# Patient Record
Sex: Male | Born: 1970
Health system: Southern US, Community
[De-identification: ages and names within clinical notes are randomized; demographics above are authoritative.]

## PROBLEM LIST (undated history)

## (undated) DIAGNOSIS — K37 Unspecified appendicitis: Secondary | ICD-10-CM

## (undated) DIAGNOSIS — I10 Essential (primary) hypertension: Secondary | ICD-10-CM

## (undated) DIAGNOSIS — E7800 Pure hypercholesterolemia, unspecified: Secondary | ICD-10-CM

## (undated) DIAGNOSIS — I493 Ventricular premature depolarization: Secondary | ICD-10-CM

## (undated) HISTORY — DX: Essential (primary) hypertension: I10

## (undated) HISTORY — PX: BACK SURGERY: SHX140

## (undated) HISTORY — DX: Unspecified appendicitis: K37

## (undated) HISTORY — DX: Ventricular premature depolarization: I49.3

---
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
## 2001-09-12 ENCOUNTER — Emergency Department (HOSPITAL_COMMUNITY): Admission: EM | Admit: 2001-09-12 | Discharge: 2001-09-12 | Payer: Self-pay | Admitting: Emergency Medicine

## 2006-12-18 ENCOUNTER — Ambulatory Visit (HOSPITAL_COMMUNITY): Admission: RE | Admit: 2006-12-18 | Discharge: 2006-12-19 | Payer: Self-pay | Admitting: Orthopedic Surgery

## 2007-04-13 ENCOUNTER — Encounter: Admission: RE | Admit: 2007-04-13 | Discharge: 2007-04-13 | Payer: Self-pay

## 2007-05-27 ENCOUNTER — Inpatient Hospital Stay (HOSPITAL_COMMUNITY): Admission: RE | Admit: 2007-05-27 | Discharge: 2007-05-31 | Payer: Self-pay | Admitting: Neurosurgery

## 2007-12-15 ENCOUNTER — Encounter
Admission: RE | Admit: 2007-12-15 | Discharge: 2007-12-15 | Payer: Self-pay | Admitting: Physical Medicine & Rehabilitation

## 2010-07-15 ENCOUNTER — Encounter: Payer: Self-pay | Admitting: Physical Medicine and Rehabilitation

## 2010-11-06 NOTE — Op Note (Signed)
NAMEGOODWIN, KAMPHAUS                ACCOUNT NO.:  [id number]   MEDICAL RECORD NO.:  [id number]          PATIENT TYPE:  INP   LOCATION:  3010                         FACILITY:  [facility]   PHYSICIAN:  Cristi Loron, M.D.DATE OF BIRTH:  24-Jun-1971   DATE OF PROCEDURE:  05/27/2007  DATE OF DISCHARGE:                               OPERATIVE REPORT   BRIEF HISTORY:  The patient is a 40 year old black male who has  undergone a previous right L5-S1 diskectomy performed by another  physician.  He had persistent back and leg pain.  He failed medical  management and worked up with a diskogram which demonstrated concordant  pain at L5-S1 as well as degenerated disk.  The patient was sent for my  opinion.  We discussed various treatment options including surgery.  The  patient has weighed the risks, benefits, and alternatives of the surgery  and decided to proceed with L5-S1 decompression and fusion.   PREOPERATIVE DIAGNOSES:  L5-S1 degenerative disk disease, spondylosis,  lumbago, and radiculopathy.   POSTOPERATIVE DIAGNOSES:  L5-S1 degenerative disk disease, spondylosis,  lumbago, and radiculopathy.   PROCEDURE:  Right L5 redo laminectomy with foraminotomy to decompress  the right L5 and S1 nerve root; right L5-S1 transforaminal lumbar  interbody fusion with local morselized autograft bone and VITOSS bone  graft extender; insertion of right L5-S1 interbody prosthesis (Capstone  PEEK interbody prosthesis); L5-S1 posterior nonsegmental instrumentation  with Legacy titanium pedicle screws and rods; L5-S1 posterolateral  arthrodesis with local morselized autograft bone and VITOSS bone graft  extender.   SURGEON:  Cristi Loron, M.D.   ASSISTANT:  Coletta Memos, M.D.   ANESTHESIA:  General endotracheal.   ESTIMATED BLOOD LOSS:  300 mL.   SPECIMENS:  None.   DRAINS:  None.   COMPLICATIONS:  None.   DESCRIPTION OF PROCEDURE:  The patient was brought to the operating room  by the  anesthesia team.  General endotracheal anesthesia was induced.  The patient was then turned to the prone position on Wilson frame.  His  lumbosacral region was then prepared with Betadine scrub and Betadine  solution.  Sterile drapes were applied.  I then injected the area to be  incised with Marcaine with epinephrine solution.  I used a scalpel  incise the patient's prior surgical incision.  I extended the incision  in a cephalad and caudal direction.  I then used electrocautery to  perform a bilateral subperiosteal dissection exposing the spinous  processs of lamina of L4-L5 in the upper sacrum.  We obtained  intraoperative radiograph to confirm our location.   We then inserted the Versa-Trac retractor for exposure.  I then used  high-speed drill to extend the patient's prior right L5 laminotomy in a  cephalad direction.  I drilled until I encounter some relatively non-  scarred ligamentum flavum.  I then used Kerrison punch to remove the  remainder of right L5-S1 ligamentum flavum.  I carefully dissected  through the epidural fibrosis and then decompressed the thecal sac and  then performed a foraminotomy about the right L5-S1 nerve root.  The  extent  of this decompression was needed for a transforaminal lumbar  fusion secondary to the epidural fibrosis and redundant ligamentum  flavum.   Having competed decompression, we now turned our attention to the  arthrodesis.  I incised the right L5-S1 intervertebral disk with a 15  blade scalpel and performed a partial intervertebral diskectomy with the  pituitary forceps.  We prepared the vertebral endplates for fusion by  removing the soft tissue using the curette and pituitary forceps.  We  then used trial spacers and determined to use a 12 x 26 mm PEEK Capstone  interbody prosthesis.  We pre-filled the prosthesis with local autograft  bone and VITOSS bone graft extender as well as filling anteriorly and  laterally in the disk space with  VITOSS and local bone.  We then  inserted the prosthesis into the interspace of course after retracting  the neural structures out of harm's way.  We then attempted to turn the  prosthesis somewhat laterally.  I could not turn it completely  laterally.  We then filled posteriorly in the disk spacer with VITOSS  and local autograft bone completing the transforaminal lumbar interbody  fusion and insertion of the interbody prosthesis.   We now turned our attention to instrumentation.  Under fluoroscopic  guidance, I cannulated the bilateral L5 and S1 pedicles with the bone  probes.  We tapped the pedicles with 7.5 mm taps then probed inside the  tapped pedicles to rule out cortical breeches.  We then inserted a 7.5 x  50 mm pedicle screws bilaterally at L5 and 7.5 X 45 bilaterally at S1  under fluoroscopic guidance.  We then connected the unilateral pedicle  screw through the lordotic rod.  We compressed the construct and then  secured the rod in place with the capsule, which we tightened  appropriately completing the instrumentation.  the rod in place with caps which were tightened appropriately completing  the instrumentation.   We now turned out attention to the posterolateral arthrodesis.  We used  a high-speed drill to decorticate the remainder of the left L5 and S1  pars region, transverse processes facet joints.  We laid a combination  of local autograft bone and VITOSS bone graft extender over these  decorticated posterolateral structures completing the posterolateral  arthrodesis.   We then inspected the right L5-S1 thecal sac and right L5 and S1 nerve  roots and noted them well decompressed.  We irrigated the wound out with  bacitracin solution and then removed the retractor and obtained  hemostasis using bipolar cautery, and then I reapproximated the  patient's thoracolumbar fascia with interrupted #1 Vicryl, subcutaneous  tissue with interrupted 2-0 Vicryl, the skin with  Steri-Strips and  Benzoin.  The wound was then coated with bacitracin ointment and sterile  dressing was applied.  The drapes were removed.  The patient was  subsequently returned to the supine position where he was extubated by  the anesthesia team and transported to the postanesthesia care unit in  stable condition.  All sponge, instrument, and needle counts were  correct at the end of the case.      Cristi Loron, M.D.  Electronically Signed     JDJ/MEDQ  D:  05/27/2007  T:  05/28/2007  Job:  [id number]

## 2010-11-06 NOTE — Discharge Summary (Signed)
[name], [name]                ACCOUNT NO.:  [id number]   MEDICAL RECORD NO.:  [id number]          PATIENT TYPE:  INP   LOCATION:  3010                         FACILITY:  [facility]   PHYSICIAN:  Danae Orleans. Venetia Maxon, M.D.  DATE OF BIRTH:  February 28, 1971   DATE OF ADMISSION:  05/27/2007  DATE OF DISCHARGE:  05/31/2007                               DISCHARGE SUMMARY   REASON FOR ADMISSION:  Lumbar disk generation with spondylosis, lumbago  and radiculopathy.   FINAL DIAGNOSIS:  Lumbar disk generation with spondylosis, lumbago and  radiculopathy.   SECONDARY DIAGNOSIS:  1. Tachycardia.  2. History of ablation for arrhythmia.   HISTORY/HOSPITAL COURSE:  Joseph Reyes is a 40 year old man who had  previously undergone lumbar diskectomy.  He developed progressive  degeneration at L5-S1 and underwent a lumbar decompression and fusion.  He had postoperatively had some difficulty with pain management, was  gradually mobilized and these issues improved.  He developed a fever  which also resolved.  He was tachycardic on the 6th and because of his  history of previous cardiac illness, he had an EKG which showed sinus  tachycardia.  He had blood work which showed satisfactory electrolytes  and hematocrit.  With his pain under good control on the 7th, he was no  longer tachycardic, was doing better, ambulating well and was discharged  home with instructions to follow up with Dr. Lovell Sheehan in the office in 2  to 3 3 weeks.  Instructed to wear his brace when up.  Given  prescriptions for Dilaudid, 4 mg p.o. q.4-6h. as needed, dispensed #100  and Valium as needed for muscular spasm.      Danae Orleans. Venetia Maxon, M.D.  Electronically Signed     JDS/MEDQ  D:  05/31/2007  T:  05/31/2007  Job:  [id number]

## 2010-11-06 NOTE — Op Note (Signed)
Joseph Reyes, Joseph Reyes                ACCOUNT NO.:  [id number]   MEDICAL RECORD NO.:  [id number]          PATIENT TYPE:  AMB   LOCATION:  DAY                          FACILITY:  [facility]   PHYSICIAN:  Ronald A. Gioffre, M.D.DATE OF BIRTH:  1971/02/05   DATE OF PROCEDURE:  12/18/2006  DATE OF DISCHARGE:                               OPERATIVE REPORT   SURGEON:  Georges Lynch. Darrelyn Hillock, M.D.   ASSISTANT:  Marlowe Kays M.D.   PREOPERATIVE DIAGNOSIS:  Large herniated lumbar disk the lateral recess  stenosis at L5-S1 on the right.  Note, he had severe pain in his right  leg, minimal in the left.   POSTOPERATIVE DIAGNOSIS:  Large herniated lumbar disk the lateral recess  stenosis at L5-S1 on the right.  Note, he had severe pain in his right  leg, minimal in the left.   OPERATION:  1. Hemilaminectomy and microdiskectomy L5-S1 on the right.  2. Decompression of the lateral recess for lateral recess stenosis at      L5-S1 on the right.   PROCEDURE:  Under general anesthesia with the patient on a spinal frame,  routine orthopedic prep and draping of the back was carried out.  He had  2 grams of IV Ancef.  Two needles were placed in the back for  localization purposes and an x-ray was taken.  An incision then was made  over the L5-S1 interspace, bleeders identified and cauterized.  The  muscle was stripped from the lamina and spinous process in the usual  fashion.  At this time we went down and inserted the [facility]  retractors.  Another x-ray was taken with an instrument in the  interlaminar space of L5-S1 and confirmed our position.  I then carried  out hemilaminectomy in the usual fashion.  I removed the ligamentum  flavum.  Note, microscope was used during the procedure.  His nerve root  was so tense and so swollen that we had to go out and decompress the  lateral recess as well where he had recess stenosis.  We did a nice wide  foraminotomy.  We totally freed up the root first because of  the size of  the disk.  We then gently retracted the nerve root with the D'Errico  retractor, made a cruciate incision in the posterior longitudinal  ligament and removed large pieces of disk material.  Multiple passes  were carried out into the disk space.  I also decompressed the disk from  the subligamentous space with the Epstein curettes.  Nerve hook also was  used.  We went down and explored proximally and distally and made sure  there were no other loose fragments.  We were able to easily pass a  hockey-stick out the foramina after the decompression.  The nerve root  moved much more freely after the decompression.  Thoroughly irrigated  out the area, loosely applied some thrombin-soaked Gelfoam, closed the  wound in layers in usual fashion except the deep distal portion of the  wound was partially left open for drainage purposes.  I injected about  20 mL of 0.5%  Marcaine and epinephrine into the muscle.  The skin was  closed with metal staples.  Sterile Neosporin dressing was applied.           ______________________________  Georges Lynch Darrelyn Hillock, M.D.    RAG/MEDQ  D:  12/18/2006  T:  12/18/2006  Job:  [id number]

## 2011-04-01 LAB — CBC
HCT: 38.1 — ABNORMAL LOW
HCT: 45.8
Hemoglobin: 13.1
MCHC: 35.1
MCV: 89.2
MCV: 89.6
Platelets: 208
Platelets: 227
RDW: 13.2
RDW: 13.3
WBC: 10.9 — ABNORMAL HIGH
WBC: 5

## 2011-04-01 LAB — BASIC METABOLIC PANEL
CO2: 28
CO2: 28
Calcium: 8.8
Chloride: 100
Chloride: 101
Creatinine, Ser: 0.92
GFR calc Af Amer: >60
GFR calc Af Amer: >60
GFR calc non Af Amer: >60
GFR calc non Af Amer: >60
Sodium: 134 — ABNORMAL LOW

## 2011-04-01 LAB — TYPE AND SCREEN
ABO/RH(D): A POS
Antibody Screen: NEGATIVE

## 2011-04-01 LAB — ABO/RH: ABO/RH(D): A POS

## 2011-04-10 LAB — APTT: aPTT: 25

## 2011-04-10 LAB — PROTIME-INR: Prothrombin Time: 12.9

## 2011-04-10 LAB — HEMOGLOBIN AND HEMATOCRIT, BLOOD: HCT: 45.8

## 2012-08-13 ENCOUNTER — Emergency Department (HOSPITAL_COMMUNITY)
Admission: EM | Admit: 2012-08-13 | Discharge: 2012-08-14 | Disposition: A | Discharge status: Home / Self Care | Payer: No Typology Code available for payment source | Attending: Emergency Medicine | Admitting: Emergency Medicine

## 2012-08-13 ENCOUNTER — Encounter (HOSPITAL_COMMUNITY): Payer: Self-pay | Admitting: *Deleted

## 2012-08-13 ENCOUNTER — Emergency Department (HOSPITAL_COMMUNITY): Payer: No Typology Code available for payment source

## 2012-08-13 DIAGNOSIS — S335XXA Sprain of ligaments of lumbar spine, initial encounter: Secondary | ICD-10-CM | POA: Insufficient documentation

## 2012-08-13 DIAGNOSIS — M549 Dorsalgia, unspecified: Secondary | ICD-10-CM

## 2012-08-13 DIAGNOSIS — Y9389 Activity, other specified: Secondary | ICD-10-CM | POA: Insufficient documentation

## 2012-08-13 MED ORDER — METHOCARBAMOL 500 MG PO TABS
500.0000 mg | ORAL_TABLET | Freq: Once | ORAL | Status: AC
  Administered 2012-08-13: 500 mg via ORAL
  Filled 2012-08-13: qty 1

## 2012-08-13 MED ORDER — TRAMADOL HCL 50 MG PO TABS: 50.0000 mg | ORAL_TABLET | Freq: Four times a day (QID) | ORAL | Status: DC | PRN

## 2012-08-13 MED ORDER — METHOCARBAMOL 500 MG PO TABS: 500.0000 mg | ORAL_TABLET | Freq: Two times a day (BID) | ORAL | Status: DC

## 2012-08-13 MED ORDER — TRAMADOL HCL 50 MG PO TABS
50.0000 mg | ORAL_TABLET | Freq: Once | ORAL | Status: AC
  Administered 2012-08-13: 50 mg via ORAL
  Filled 2012-08-13: qty 1
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
## 2012-08-13 NOTE — ED Notes (Signed)
PT in X-ray.
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
## 2012-08-14 NOTE — ED Provider Notes (Signed)
Medical screening examination/treatment/procedure(s) were performed by non-physician practitioner and as supervising physician I was immediately available for consultation/collaboration.  Olivia Mackie, MD 08/14/12 [phone]

## 2014-10-30 ENCOUNTER — Emergency Department (HOSPITAL_COMMUNITY)
Admission: EM | Admit: 2014-10-30 | Discharge: 2014-10-30 | Disposition: A | Discharge status: Home / Self Care | Payer: Self-pay | Attending: Emergency Medicine | Admitting: Emergency Medicine

## 2014-10-30 ENCOUNTER — Encounter (HOSPITAL_COMMUNITY): Payer: Self-pay | Admitting: Emergency Medicine

## 2014-10-30 DIAGNOSIS — Y998 Other external cause status: Secondary | ICD-10-CM | POA: Insufficient documentation

## 2014-10-30 DIAGNOSIS — Z79899 Other long term (current) drug therapy: Secondary | ICD-10-CM | POA: Insufficient documentation

## 2014-10-30 DIAGNOSIS — W57XXXA Bitten or stung by nonvenomous insect and other nonvenomous arthropods, initial encounter: Secondary | ICD-10-CM | POA: Insufficient documentation

## 2014-10-30 DIAGNOSIS — S60561A Insect bite (nonvenomous) of right hand, initial encounter: Secondary | ICD-10-CM | POA: Insufficient documentation

## 2014-10-30 DIAGNOSIS — Z8639 Personal history of other endocrine, nutritional and metabolic disease: Secondary | ICD-10-CM | POA: Insufficient documentation

## 2014-10-30 DIAGNOSIS — Y9289 Other specified places as the place of occurrence of the external cause: Secondary | ICD-10-CM | POA: Insufficient documentation

## 2014-10-30 DIAGNOSIS — S60562A Insect bite (nonvenomous) of left hand, initial encounter: Secondary | ICD-10-CM | POA: Insufficient documentation

## 2014-10-30 DIAGNOSIS — R21 Rash and other nonspecific skin eruption: Secondary | ICD-10-CM

## 2014-10-30 DIAGNOSIS — Y9389 Activity, other specified: Secondary | ICD-10-CM | POA: Insufficient documentation

## 2014-10-30 HISTORY — DX: Pure hypercholesterolemia, unspecified: E78.00

## 2014-10-30 MED ORDER — DOXYCYCLINE HYCLATE 100 MG PO CAPS: 100.0000 mg | ORAL_CAPSULE | Freq: Two times a day (BID) | ORAL | Status: DC

## 2014-10-30 NOTE — ED Provider Notes (Signed)
CSN: [id number]     Arrival date & time 10/30/14  0557 History   First MD Initiated Contact with Patient 10/30/14 0630     Chief Complaint  Patient presents with  . Rash     (Consider location/radiation/quality/duration/timing/severity/associated sxs/prior Treatment) HPI Joseph Reyes is a 44 y.o. male who comes in for evaluation of tick bite and subsequent rash. Patient states he believes he was bitten by a tick on Tuesday, found the tick attached on his left flank on Thursday. Patient brought tick with him. It appears to be a deer tick and is not engorged. Patient reports associated rash to bilateral upper extremities, non pruritic. Denies fevers, headache, neck stiffness or pain, nausea or vomiting, abdominal pain, difficulties breathing or swallowing. Reports he has been taking Tylenol, Benadryl and Zyrtec. No other aggravating or modifying factors.  Past Medical History  Diagnosis Date  . Hypercholesteremia    Past Surgical History  Procedure Laterality Date  . Back surgery     No family history on file. History  Substance Use Topics  . Smoking status: Never Smoker   . Smokeless tobacco: Not on file  . Alcohol Use: No    Review of Systems A 10 point review of systems was completed and was negative except for pertinent positives and negatives as mentioned in the history of present illness     Allergies  Ibuprofen and Percocet  Home Medications   Prior to Admission medications   Medication Sig Start Date End Date Taking? Authorizing Provider  doxycycline (VIBRAMYCIN) 100 MG capsule Take 1 capsule (100 mg total) by mouth 2 (two) times daily. One po bid x 10 days 10/30/14   Joycie PeekBenjamin Claus Silvestro, PA-C  methocarbamol (ROBAXIN) 500 MG tablet Take 1 tablet (500 mg total) by mouth 2 (two) times daily. 08/13/12   Ivonne AndrewPeter Dammen, PA-C  traMADol (ULTRAM) 50 MG tablet Take 1 tablet (50 mg total) by mouth every 6 (six) hours as needed for pain. 08/13/12   Peter Dammen, PA-C   BP 141/81 mmHg   Pulse 65  Temp(Src) 98.2 F (36.8 C) (Oral)  Resp 14  Ht 6\' 4"  (1.93 m)  Wt 300 lb (136.079 kg)  BMI 36.53 kg/m2  SpO2 98% Physical Exam  Constitutional: He is oriented to person, place, and time. He appears well-developed and well-nourished.  HENT:  Head: Normocephalic and atraumatic.  Mouth/Throat: Oropharynx is clear and moist.  Eyes: Conjunctivae are normal. Pupils are equal, round, and reactive to light. Right eye exhibits no discharge. Left eye exhibits no discharge. No scleral icterus.  Neck: Neck supple.  Cardiovascular: Normal rate, regular rhythm and normal heart sounds.   Pulmonary/Chest: Effort normal and breath sounds normal. No respiratory distress. He has no wheezes. He has no rales.  Abdominal: Soft. There is no tenderness.  Musculoskeletal: He exhibits no tenderness.  Neurological: He is alert and oriented to person, place, and time.  Cranial Nerves II-XII grossly intact. Moves all extremities without ataxia. Appears to be baseline for patient.  Skin: Skin is warm and dry. No rash noted.  Small, diffuse maculopapular, circumferential lesions with excoriations throughout bilateral upper extremities. No drainage or scaling. No central clearing, no necrosis. No rash or lesions noted to left flank.  Psychiatric: He has a normal mood and affect.  Nursing note and vitals reviewed.   ED Course  Procedures (including critical care time) Labs Review Labs Reviewed - No data to display  Imaging Review No results found.   EKG Interpretation None  Meds given in ED:  Medications - No data to display  Discharge Medication List as of 10/30/2014  6:48 AM    START taking these medications   Details  doxycycline (VIBRAMYCIN) 100 MG capsule Take 1 capsule (100 mg total) by mouth 2 (two) times daily. One po bid x 10 days, Starting 10/30/2014, Until Discontinued, Print       Filed Vitals:   10/30/14 0610 10/30/14 0611 10/30/14 0612 10/30/14 0657  BP: 141/81  141/81  141/81  Pulse:  65  76  Temp:    98.4 F (36.9 C)  TempSrc:    Oral  Resp:    16  Height:      Weight:      SpO2:  98%  98%    MDM  Vitals stable - WNL -afebrile Pt resting comfortably in ED. PE--no evidence of erythema migrans or other concerning rash. Otherwise grossly benign physical exam  DDX--patient found tick on his body, attached but not engorged. Reports subsequent bilateral upper extremity rash. Denies fevers, chills, headache, nausea or vomiting, abdominal pain, neurological symptoms. I doubt Lyme or RMSF, however will treat with doxycycline. Encourage continue use of Tylenol, Zyrtec and Benadryl for other allergy symptoms.  I discussed all relevant lab findings and imaging results with pt and they verbalized understanding. Discussed f/u with PCP within 48 hrs and return precautions, pt very amenable to plan.  Final diagnoses:  Rash  Tick bite        Joycie PeekBenjamin Adiel Erney, PA-C 10/30/14 [phone]  Geoffery Lyonsouglas Delo, MD 10/30/14 2042

## 2014-10-30 NOTE — Discharge Instructions (Signed)
Please take your antibiotics as prescribed. Take all of the antibiotics until gone. Follow-up with your primary care for further evaluation and management of your symptoms. Return to ED for new or worsening symptoms like fever, headache, nausea or vomiting, abdominal pain, neurologic symptoms.  Lyme Disease You may have been bitten by a tick and are to watch for the development of Lyme Disease. Lyme Disease is an infection that is caused by a bacteria The bacteria causing this disease is named Borreilia burgdorferi. If a tick is infected with this bacteria and then bites you, then Lyme Disease may occur. These ticks are carried by deer and rodents such as rabbits and mice and infest grassy as well as forested areas. Fortunately most tick bites do not cause Lyme Disease.  Lyme Disease is easier to prevent than to treat. First, covering your legs with clothing when walking in areas where ticks are possibly abundant will prevent their attachment because ticks tend to stay within inches of the ground. Second, using insecticides containing DEET can be applied on skin or clothing. Last, because it takes about 12 to 24 hours for the tick to transmit the disease after attachment to the human host, you should inspect your body for ticks twice a day when you are in areas where Lyme Disease is common. You must look thoroughly when searching for ticks. The Ixodes tick that carries Lyme Disease is very small. It is around the size of a sesame seed (picture of tick is not actual size). Removal is best done by grasping the tick by the head and pulling it out. Do not to squeeze the body of the tick. This could inject the infecting bacteria into the bite site. Wash the area of the bite with an antiseptic solution after removal.  Lyme Disease is a disease that may affect many body systems. Because of the small size of the biting tick, most people do not notice being bitten. The first sign of an infection is usually a round red  rash that extends out from the center of the tick bite. The center of the lesion may be blood colored (hemorrhagic) or have tiny blisters (vesicular). Most lesions have bright red outer borders and partial central clearing. This rash may extend out many inches in diameter, and multiple lesions may be present. Other symptoms such as fatigue, headaches, chills and fever, general achiness and swelling of lymph glands may also occur. If this first stage of the disease is left untreated, these symptoms may gradually resolve by themselves, or progressive symptoms may occur because of spread of infection to other areas of the body.  Follow up with your caregiver to have testing and treatment if you have a tick bite and you develop any of the above complaints. Your caregiver may recommend preventative (prophylactic) medications which kill bacteria (antibiotics). Once a diagnosis of Lyme Disease is made, antibiotic treatment is highly likely to cure the disease. Effective treatment of late stage Lyme Disease may require longer courses of antibiotic therapy.  MAKE SURE YOU:   Understand these instructions.  Will watch your condition.  Will get help right away if you are not doing well or get worse. Document Released: 09/16/2000 Document Revised: 09/02/2011 Document Reviewed: 11/18/2008 [facility] Patient Information 2015 [place]. This information is not intended to replace advice given to you by your health care provider. Make sure you discuss any questions you have with your health care provider.

## 2014-10-30 NOTE — ED Notes (Addendum)
Pt reports rash on BUE and hands beginning Tuesday, itching waking pt from sleep. Pt denies pain.   Pt reports finding a tick on his back on Thursday.  Pt denies headache,N/V/D, fevers or chills.

## 2015-02-01 ENCOUNTER — Observation Stay (HOSPITAL_COMMUNITY)
Admission: EM | Admit: 2015-02-01 | Discharge: 2015-02-03 | Disposition: A | Discharge status: Home / Self Care | Payer: 59 | Attending: Surgery | Admitting: Surgery

## 2015-02-01 ENCOUNTER — Encounter (HOSPITAL_COMMUNITY): Payer: Self-pay | Admitting: Emergency Medicine

## 2015-02-01 DIAGNOSIS — K550 Acute vascular disorders of intestine: Secondary | ICD-10-CM | POA: Diagnosis not present

## 2015-02-01 DIAGNOSIS — E780 Pure hypercholesterolemia: Secondary | ICD-10-CM | POA: Insufficient documentation

## 2015-02-01 DIAGNOSIS — Z885 Allergy status to narcotic agent status: Secondary | ICD-10-CM | POA: Insufficient documentation

## 2015-02-01 DIAGNOSIS — K3580 Unspecified acute appendicitis: Secondary | ICD-10-CM | POA: Diagnosis present

## 2015-02-01 DIAGNOSIS — Z79899 Other long term (current) drug therapy: Secondary | ICD-10-CM | POA: Diagnosis not present

## 2015-02-01 DIAGNOSIS — N400 Enlarged prostate without lower urinary tract symptoms: Secondary | ICD-10-CM | POA: Diagnosis not present

## 2015-02-01 DIAGNOSIS — Z886 Allergy status to analgesic agent status: Secondary | ICD-10-CM | POA: Insufficient documentation

## 2015-02-01 DIAGNOSIS — R1031 Right lower quadrant pain: Secondary | ICD-10-CM | POA: Diagnosis not present

## 2015-02-01 DIAGNOSIS — K37 Unspecified appendicitis: Secondary | ICD-10-CM | POA: Diagnosis present

## 2015-02-01 NOTE — ED Notes (Signed)
Pt states he has been having abd pain off and on for about a week  Pt states this morning the pain was different  Pt states the pain is on the right side  Pt states it fluctuates between dull and sharp in nature   Denies N/V/D

## 2015-02-02 ENCOUNTER — Observation Stay (HOSPITAL_COMMUNITY): Payer: 59 | Admitting: Anesthesiology

## 2015-02-02 ENCOUNTER — Encounter (HOSPITAL_COMMUNITY): Payer: Self-pay | Admitting: Surgery

## 2015-02-02 ENCOUNTER — Encounter (HOSPITAL_COMMUNITY)
Admission: EM | Disposition: A | Discharge status: Home / Self Care | Payer: Self-pay | Source: Home / Self Care | Attending: Emergency Medicine

## 2015-02-02 ENCOUNTER — Emergency Department (HOSPITAL_COMMUNITY): Payer: 59

## 2015-02-02 DIAGNOSIS — K3580 Unspecified acute appendicitis: Secondary | ICD-10-CM | POA: Diagnosis present

## 2015-02-02 DIAGNOSIS — K37 Unspecified appendicitis: Secondary | ICD-10-CM | POA: Diagnosis present

## 2015-02-02 HISTORY — PX: LAPAROSCOPIC APPENDECTOMY: SHX408

## 2015-02-02 LAB — CBC
HCT: 41.8 % (ref 39.0–52.0)
HEMATOCRIT: 45.2 % (ref 39.0–52.0)
Hemoglobin: 15.2 g/dL (ref 13.0–17.0)
Hemoglobin: 14.3 g/dL (ref 13.0–17.0)
MCH: 30.6 pg (ref 26.0–34.0)
MCH: 31.2 pg (ref 26.0–34.0)
MCHC: 33.6 g/dL (ref 30.0–36.0)
MCHC: 34.2 g/dL (ref 30.0–36.0)
MCV: 91.1 fL (ref 78.0–100.0)
MCV: 91.3 fL (ref 78.0–100.0)
PLATELETS: 237 10*3/uL (ref 150–400)
Platelets: 273 10*3/uL (ref 150–400)
RBC: 4.96 MIL/uL (ref 4.22–5.81)
RBC: 4.58 MIL/uL (ref 4.22–5.81)
RDW: 13.5 % (ref 11.5–15.5)
RDW: 13.4 % (ref 11.5–15.5)
WBC: 5.4 10*3/uL (ref 4.0–10.5)
WBC: 7.2 10*3/uL (ref 4.0–10.5)

## 2015-02-02 LAB — URINE MICROSCOPIC-ADD ON

## 2015-02-02 LAB — CREATININE, SERUM
CREATININE: 0.99 mg/dL (ref 0.61–1.24)
GFR calc Af Amer: >60 mL/min (ref >60)

## 2015-02-02 LAB — COMPREHENSIVE METABOLIC PANEL
ALK PHOS: 56 U/L (ref 38–126)
ALT: 30 U/L (ref 17–63)
ANION GAP: 8 (ref 5–15)
AST: 22 U/L (ref 15–41)
Albumin: 4.3 g/dL (ref 3.5–5.0)
BUN: 15 mg/dL (ref 6–20)
CHLORIDE: 106 mmol/L (ref 101–111)
CO2: 28 mmol/L (ref 22–32)
CREATININE: 1.19 mg/dL (ref 0.61–1.24)
Calcium: 9.7 mg/dL (ref 8.9–10.3)
GFR calc non Af Amer: >60 mL/min (ref >60)
GLUCOSE: 110 mg/dL — AB (ref 65–99)
Potassium: 4.2 mmol/L (ref 3.5–5.1)
SODIUM: 142 mmol/L (ref 135–145)
Total Bilirubin: 1.1 mg/dL (ref 0.3–1.2)
Total Protein: 8.1 g/dL (ref 6.5–8.1)

## 2015-02-02 LAB — URINALYSIS, ROUTINE W REFLEX MICROSCOPIC
Bilirubin Urine: NEGATIVE
GLUCOSE, UA: NEGATIVE mg/dL
KETONES UR: NEGATIVE mg/dL
Leukocytes, UA: NEGATIVE
Nitrite: NEGATIVE
PROTEIN: NEGATIVE mg/dL
Specific Gravity, Urine: 1.029 (ref 1.005–1.030)
UROBILINOGEN UA: 1 mg/dL (ref 0.0–1.0)
pH: 5.5 (ref 5.0–8.0)

## 2015-02-02 LAB — LIPASE, BLOOD: Lipase: 17 U/L — ABNORMAL LOW (ref 22–51)

## 2015-02-02 SURGERY — APPENDECTOMY, LAPAROSCOPIC
Anesthesia: General

## 2015-02-02 MED ORDER — ONDANSETRON HCL 4 MG/2ML IJ SOLN: 4.0000 mg | Freq: Four times a day (QID) | INTRAMUSCULAR | Status: DC | PRN

## 2015-02-02 MED ORDER — OXYCODONE HCL 5 MG PO TABS: 5.0000 mg | ORAL_TABLET | ORAL | Status: DC | PRN

## 2015-02-02 MED ORDER — SODIUM CHLORIDE 0.9 % IV SOLN
3.0000 g | Freq: Once | INTRAVENOUS | Status: AC
  Administered 2015-02-02: 3 g via INTRAVENOUS
  Filled 2015-02-02: qty 3

## 2015-02-02 MED ORDER — FENTANYL CITRATE (PF) 100 MCG/2ML IJ SOLN
25.0000 ug | INTRAMUSCULAR | Status: DC | PRN
  Administered 2015-02-02 (×2): 50 ug via INTRAVENOUS

## 2015-02-02 MED ORDER — SODIUM CHLORIDE 0.9 % IV SOLN
INTRAVENOUS | Status: DC
  Administered 2015-02-02: 04:00:00 via INTRAVENOUS

## 2015-02-02 MED ORDER — LIDOCAINE HCL (CARDIAC) 20 MG/ML IV SOLN
INTRAVENOUS | Status: AC
  Filled 2015-02-02: qty 5

## 2015-02-02 MED ORDER — SODIUM CHLORIDE 0.9 % IV SOLN
3.0000 g | Freq: Four times a day (QID) | INTRAVENOUS | Status: AC
  Administered 2015-02-02 – 2015-02-03 (×4): 3 g via INTRAVENOUS
  Filled 2015-02-02 (×4): qty 3

## 2015-02-02 MED ORDER — SODIUM CHLORIDE 0.9 % IV SOLN: 3.0000 g | Freq: Four times a day (QID) | INTRAVENOUS | Status: DC

## 2015-02-02 MED ORDER — MENTHOL 3 MG MT LOZG
1.0000 | LOZENGE | OROMUCOSAL | Status: DC | PRN
  Filled 2015-02-02 (×2): qty 9

## 2015-02-02 MED ORDER — FENTANYL CITRATE (PF) 100 MCG/2ML IJ SOLN
50.0000 ug | Freq: Once | INTRAMUSCULAR | Status: AC
  Administered 2015-02-02: 50 ug via INTRAVENOUS
  Filled 2015-02-02: qty 2

## 2015-02-02 MED ORDER — HEPARIN SODIUM (PORCINE) 5000 UNIT/ML IJ SOLN
5000.0000 [IU] | Freq: Three times a day (TID) | INTRAMUSCULAR | Status: DC
Start: 2015-02-02 — End: 2015-02-03
  Administered 2015-02-02 – 2015-02-03 (×2): 5000 [IU] via SUBCUTANEOUS
  Filled 2015-02-02 (×5): qty 1

## 2015-02-02 MED ORDER — PROPOFOL 10 MG/ML IV BOLUS
INTRAVENOUS | Status: AC
Start: 2015-02-02 — End: 2015-02-02
  Filled 2015-02-02: qty 20

## 2015-02-02 MED ORDER — LACTATED RINGERS IV SOLN
INTRAVENOUS | Status: DC | PRN
Start: 2015-02-02 — End: 2015-02-02
  Administered 2015-02-02 (×2): via INTRAVENOUS

## 2015-02-02 MED ORDER — ONDANSETRON 4 MG PO TBDP: 4.0000 mg | ORAL_TABLET | Freq: Four times a day (QID) | ORAL | Status: DC | PRN

## 2015-02-02 MED ORDER — NEOSTIGMINE METHYLSULFATE 10 MG/10ML IV SOLN
INTRAVENOUS | Status: DC | PRN
  Administered 2015-02-02: 4 mg via INTRAVENOUS

## 2015-02-02 MED ORDER — LACTATED RINGERS IV SOLN: INTRAVENOUS | Status: DC

## 2015-02-02 MED ORDER — SODIUM CHLORIDE 0.9 % IV SOLN
3.0000 g | Freq: Four times a day (QID) | INTRAVENOUS | Status: DC
  Filled 2015-02-02: qty 3

## 2015-02-02 MED ORDER — HYDROMORPHONE HCL 1 MG/ML IJ SOLN
1.0000 mg | INTRAMUSCULAR | Status: DC | PRN
  Administered 2015-02-02 (×2): 1 mg via INTRAVENOUS
  Filled 2015-02-02 (×2): qty 1

## 2015-02-02 MED ORDER — FENTANYL CITRATE (PF) 100 MCG/2ML IJ SOLN: 12.5000 ug | INTRAMUSCULAR | Status: DC | PRN

## 2015-02-02 MED ORDER — LIDOCAINE HCL (CARDIAC) 20 MG/ML IV SOLN
INTRAVENOUS | Status: DC | PRN
Start: 2015-02-02 — End: 2015-02-02
  Administered 2015-02-02: 50 mg via INTRAVENOUS

## 2015-02-02 MED ORDER — FENTANYL CITRATE (PF) 100 MCG/2ML IJ SOLN
INTRAMUSCULAR | Status: AC
  Filled 2015-02-02: qty 2

## 2015-02-02 MED ORDER — ROCURONIUM BROMIDE 100 MG/10ML IV SOLN
INTRAVENOUS | Status: DC | PRN
  Administered 2015-02-02: 10 mg via INTRAVENOUS
  Administered 2015-02-02: 35 mg via INTRAVENOUS
  Administered 2015-02-02: 5 mg via INTRAVENOUS

## 2015-02-02 MED ORDER — MIDAZOLAM HCL 5 MG/5ML IJ SOLN
INTRAMUSCULAR | Status: DC | PRN
  Administered 2015-02-02: 2 mg via INTRAVENOUS

## 2015-02-02 MED ORDER — GLYCOPYRROLATE 0.2 MG/ML IJ SOLN
INTRAMUSCULAR | Status: DC | PRN
  Administered 2015-02-02: 0.6 mg via INTRAVENOUS

## 2015-02-02 MED ORDER — DEXAMETHASONE SODIUM PHOSPHATE 10 MG/ML IJ SOLN
INTRAMUSCULAR | Status: DC | PRN
  Administered 2015-02-02: 10 mg via INTRAVENOUS

## 2015-02-02 MED ORDER — BUPIVACAINE-EPINEPHRINE (PF) 0.25% -1:200000 IJ SOLN
INTRAMUSCULAR | Status: AC
  Filled 2015-02-02: qty 30

## 2015-02-02 MED ORDER — PROPOFOL 10 MG/ML IV BOLUS
INTRAVENOUS | Status: DC | PRN
  Administered 2015-02-02: 200 mg via INTRAVENOUS

## 2015-02-02 MED ORDER — BUPIVACAINE-EPINEPHRINE 0.25% -1:200000 IJ SOLN
INTRAMUSCULAR | Status: DC | PRN
Start: 2015-02-02 — End: 2015-02-02
  Administered 2015-02-02: 20 mL

## 2015-02-02 MED ORDER — FENTANYL CITRATE (PF) 100 MCG/2ML IJ SOLN
INTRAMUSCULAR | Status: DC | PRN
  Administered 2015-02-02: 50 ug via INTRAVENOUS
  Administered 2015-02-02 (×2): 100 ug via INTRAVENOUS

## 2015-02-02 MED ORDER — ROCURONIUM BROMIDE 100 MG/10ML IV SOLN
INTRAVENOUS | Status: AC
  Filled 2015-02-02: qty 1

## 2015-02-02 MED ORDER — POTASSIUM CHLORIDE IN NACL 20-0.9 MEQ/L-% IV SOLN: INTRAVENOUS | Status: DC

## 2015-02-02 MED ORDER — FENTANYL CITRATE (PF) 250 MCG/5ML IJ SOLN
INTRAMUSCULAR | Status: AC
  Filled 2015-02-02: qty 25

## 2015-02-02 MED ORDER — KCL IN DEXTROSE-NACL 20-5-0.45 MEQ/L-%-% IV SOLN
INTRAVENOUS | Status: DC
  Administered 2015-02-02: 08:00:00 via INTRAVENOUS
  Filled 2015-02-02 (×2): qty 1000

## 2015-02-02 MED ORDER — SUCCINYLCHOLINE CHLORIDE 20 MG/ML IJ SOLN
INTRAMUSCULAR | Status: DC | PRN
  Administered 2015-02-02: 160 mg via INTRAVENOUS

## 2015-02-02 MED ORDER — MIDAZOLAM HCL 2 MG/2ML IJ SOLN
INTRAMUSCULAR | Status: AC
  Filled 2015-02-02: qty 4

## 2015-02-02 MED ORDER — HYDROCODONE-ACETAMINOPHEN 5-325 MG PO TABS
1.0000 | ORAL_TABLET | ORAL | Status: DC | PRN
  Administered 2015-02-02: 2 via ORAL
  Administered 2015-02-02 – 2015-02-03 (×2): 1 via ORAL
  Administered 2015-02-03: 2 via ORAL
  Filled 2015-02-02 (×2): qty 2
  Filled 2015-02-02 (×2): qty 1
  Filled 2015-02-02: qty 2

## 2015-02-02 MED ORDER — ONDANSETRON HCL 4 MG/2ML IJ SOLN
INTRAMUSCULAR | Status: DC | PRN
  Administered 2015-02-02: 4 mg via INTRAVENOUS

## 2015-02-02 MED ORDER — KCL IN DEXTROSE-NACL 20-5-0.45 MEQ/L-%-% IV SOLN
INTRAVENOUS | Status: DC
  Administered 2015-02-02: 15:00:00 via INTRAVENOUS
  Administered 2015-02-03: 75 mL/h via INTRAVENOUS
  Filled 2015-02-02 (×3): qty 1000

## 2015-02-02 MED ORDER — ONDANSETRON 4 MG PO TBDP
4.0000 mg | ORAL_TABLET | Freq: Four times a day (QID) | ORAL | Status: DC | PRN
Start: 2015-02-02 — End: 2015-02-02

## 2015-02-02 SURGICAL SUPPLY — 41 items
APL SKNCLS STERI-STRIP NONHPOA (GAUZE/BANDAGES/DRESSINGS) ×1
APPLIER CLIP ROT 10 11.4 M/L (STAPLE)
APR CLP MED LRG 11.4X10 (STAPLE)
BAG SPEC RTRVL LRG 6X4 10 (ENDOMECHANICALS) ×1
BENZOIN TINCTURE PRP APPL 2/3 (GAUZE/BANDAGES/DRESSINGS) ×3 IMPLANT
CLIP APPLIE ROT 10 11.4 M/L (STAPLE) IMPLANT
CLOSURE WOUND 1/2 X4 (GAUZE/BANDAGES/DRESSINGS) ×1
COVER SURGICAL LIGHT HANDLE (MISCELLANEOUS) ×3 IMPLANT
CUTTER FLEX LINEAR 45M (STAPLE) ×2 IMPLANT
DECANTER SPIKE VIAL GLASS SM (MISCELLANEOUS) ×3 IMPLANT
DRAPE LAPAROSCOPIC ABDOMINAL (DRAPES) ×3 IMPLANT
ELECT PENCIL ROCKER SW 15FT (MISCELLANEOUS) IMPLANT
ELECT REM PT RETURN 9FT ADLT (ELECTROSURGICAL) ×3
ELECTRODE REM PT RTRN 9FT ADLT (ELECTROSURGICAL) ×1 IMPLANT
ENDOLOOP SUT PDS II  0 18 (SUTURE)
ENDOLOOP SUT PDS II 0 18 (SUTURE) IMPLANT
GLOVE BIOGEL PI IND STRL 7.0 (GLOVE) ×1 IMPLANT
GLOVE BIOGEL PI INDICATOR 7.0 (GLOVE) ×2
GLOVE SURG ORTHO 8.0 STRL STRW (GLOVE) ×3 IMPLANT
GOWN STRL REUS W/TWL LRG LVL3 (GOWN DISPOSABLE) ×3 IMPLANT
GOWN STRL REUS W/TWL XL LVL3 (GOWN DISPOSABLE) ×6 IMPLANT
KIT BASIN OR (CUSTOM PROCEDURE TRAY) ×3 IMPLANT
POUCH SPECIMEN RETRIEVAL 10MM (ENDOMECHANICALS) ×2 IMPLANT
RELOAD 45 VASCULAR/THIN (ENDOMECHANICALS) IMPLANT
RELOAD STAPLE 45 2.5 WHT GRN (ENDOMECHANICALS) IMPLANT
RELOAD STAPLE 45 3.5 BLU ETS (ENDOMECHANICALS) IMPLANT
RELOAD STAPLE TA45 3.5 REG BLU (ENDOMECHANICALS) ×3 IMPLANT
SET IRRIG TUBING LAPAROSCOPIC (IRRIGATION / IRRIGATOR) IMPLANT
SHEARS HARMONIC ACE PLUS 36CM (ENDOMECHANICALS) ×2 IMPLANT
SOLUTION ANTI FOG 6CC (MISCELLANEOUS) ×1 IMPLANT
STRIP CLOSURE SKIN 1/2X4 (GAUZE/BANDAGES/DRESSINGS) ×2 IMPLANT
SUT MNCRL AB 4-0 PS2 18 (SUTURE) ×3 IMPLANT
TOWEL OR 17X26 10 PK STRL BLUE (TOWEL DISPOSABLE) ×3 IMPLANT
TRAY FOLEY W/METER SILVER 14FR (SET/KITS/TRAYS/PACK) ×3 IMPLANT
TRAY FOLEY W/METER SILVER 16FR (SET/KITS/TRAYS/PACK) ×3 IMPLANT
TRAY LAPAROSCOPIC (CUSTOM PROCEDURE TRAY) ×3 IMPLANT
TROCAR BLADELESS OPT 5 75 (ENDOMECHANICALS) ×3 IMPLANT
TROCAR XCEL BLUNT TIP 100MML (ENDOMECHANICALS) ×3 IMPLANT
TROCAR XCEL NON-BLD 11X100MML (ENDOMECHANICALS) ×3 IMPLANT
TUBING INSUFFLATION 10FT LAP (TUBING) ×3 IMPLANT
WATER STERILE IRR 1500ML POUR (IV SOLUTION) ×3 IMPLANT

## 2015-02-02 NOTE — Anesthesia Postprocedure Evaluation (Signed)
  Anesthesia Post-op Note  Patient: Joseph Reyes  Procedure(s) Performed: Procedure(s) (LRB): APPENDECTOMY LAPAROSCOPIC (N/A)  Patient Location: PACU  Anesthesia Type: General  Level of Consciousness: awake and alert   Airway and Oxygen Therapy: Patient Spontanous Breathing  Post-op Pain: mild  Post-op Assessment: Post-op Vital signs reviewed, Patient's Cardiovascular Status Stable, Respiratory Function Stable, Patent Airway and No signs of Nausea or vomiting  Last Vitals:  Filed Vitals:   02/02/15 1349  BP: 132/86  Pulse: 94  Temp: 36.9 C  Resp: 16    Post-op Vital Signs: stable   Complications: No apparent anesthesia complications

## 2015-02-02 NOTE — H&P (Signed)
Chief Complaint:  Right lower quadrant pain and appendicitis by CT  History of Present Illness:  Joseph Reyes is an 44 y.o. male who presented early this morning with complaints of right lower quadrant pain.  CT done without contrast for stones showed stranding around a plump appendix.  Plan admit and lap appendectomy.    Past Medical History  Diagnosis Date  . Hypercholesteremia     Past Surgical History  Procedure Laterality Date  . Back surgery      Current Facility-Administered Medications  Medication Dose Route Frequency Provider Last Rate Last Dose  . 0.9 %  sodium chloride infusion   Intravenous Continuous Rolland Porter, MD 100 mL/hr at 02/02/15 0349     Current Outpatient Prescriptions  Medication Sig Dispense Refill  . cetirizine (ZYRTEC) 10 MG tablet Take 10 mg by mouth daily.    Marland Kitchen doxycycline (VIBRAMYCIN) 100 MG capsule Take 1 capsule (100 mg total) by mouth 2 (two) times daily. One po bid x 10 days (Patient not taking: Reported on 02/02/2015) 20 capsule 0  . methocarbamol (ROBAXIN) 500 MG tablet Take 1 tablet (500 mg total) by mouth 2 (two) times daily. (Patient not taking: Reported on 02/02/2015) 20 tablet 0  . traMADol (ULTRAM) 50 MG tablet Take 1 tablet (50 mg total) by mouth every 6 (six) hours as needed for pain. (Patient not taking: Reported on 02/02/2015) 15 tablet 0   Ibuprofen and Percocet Family History  Problem Relation Age of Onset  . Hypertension Father    Social History:   reports that he has never smoked. He does not have any smokeless tobacco history on file. He reports that he does not drink alcohol or use illicit drugs.   REVIEW OF SYSTEMS : Negative except for problem list  Physical Exam:   Blood pressure 135/77, pulse 55, temperature 97.8 F (36.6 C), temperature source Oral, resp. rate 13, height 6' 4.5" (1.943 m), weight 136.079 kg (300 lb), SpO2 97 %. Body mass index is 36.05 kg/(m^2).  Gen:  WDWN AAM NAD  Neurological: Alert and oriented to  person, place, and time. Motor and sensory function is grossly intact  Head: Normocephalic and atraumatic.  Eyes: Conjunctivae are normal. Pupils are equal, round, and reactive to light. No scleral icterus.  Neck: Normal range of motion. Neck supple. No tracheal deviation or thyromegaly present.  Cardiovascular:  SR without murmurs or gallops.  No carotid bruits Breast:  Not examined Respiratory: Effort normal.  No respiratory distress. No chest wall tenderness. Breath sounds normal.  No wheezes, rales or rhonchi.  Abdomen:  Right lower quadrant pain GU:  Not examined Musculoskeletal: Normal range of motion. Extremities are nontender. No cyanosis, edema or clubbing noted Lymphadenopathy: No cervical, preauricular, postauricular or axillary adenopathy is present Skin: Skin is warm and dry. No rash noted. No diaphoresis. No erythema. No pallor. Pscyh: Normal mood and affect. Behavior is normal. Judgment and thought content normal.   LABORATORY RESULTS: Results for orders placed or performed during the hospital encounter of 02/01/15 (from the past 48 hour(s))  Lipase, blood     Status: Abnormal   Collection Time: 02/01/15 11:23 PM  Result Value Ref Range   Lipase 17 (L) 22 - 51 U/L  Comprehensive metabolic panel     Status: Abnormal   Collection Time: 02/01/15 11:23 PM  Result Value Ref Range   Sodium 142 135 - 145 mmol/L   Potassium 4.2 3.5 - 5.1 mmol/L   Chloride 106 101 - 111 mmol/L  CO2 28 22 - 32 mmol/L   Glucose, Bld 110 (H) 65 - 99 mg/dL   BUN 15 6 - 20 mg/dL   Creatinine, Ser 1.19 0.61 - 1.24 mg/dL   Calcium 9.7 8.9 - 10.3 mg/dL   Total Protein 8.1 6.5 - 8.1 g/dL   Albumin 4.3 3.5 - 5.0 g/dL   AST 22 15 - 41 U/L   ALT 30 17 - 63 U/L   Alkaline Phosphatase 56 38 - 126 U/L   Total Bilirubin 1.1 0.3 - 1.2 mg/dL   GFR calc non Af Amer >60 >60 mL/min   GFR calc Af Amer >60 >60 mL/min    Comment: (NOTE) The eGFR has been calculated using the CKD EPI equation. This calculation  has not been validated in all clinical situations. eGFR's persistently <60 mL/min signify possible Chronic Kidney Disease.    Anion gap 8 5 - 15  CBC     Status: None   Collection Time: 02/01/15 11:23 PM  Result Value Ref Range   WBC 5.4 4.0 - 10.5 K/uL   RBC 4.96 4.22 - 5.81 MIL/uL   Hemoglobin 15.2 13.0 - 17.0 g/dL   HCT 45.2 39.0 - 52.0 %   MCV 91.1 78.0 - 100.0 fL   MCH 30.6 26.0 - 34.0 pg   MCHC 33.6 30.0 - 36.0 g/dL   RDW 13.5 11.5 - 15.5 %   Platelets 273 150 - 400 K/uL  Urinalysis, Routine w reflex microscopic (not at [facility])     Status: Abnormal   Collection Time: 02/02/15 12:52 AM  Result Value Ref Range   Color, Urine YELLOW YELLOW   APPearance CLEAR CLEAR   Specific Gravity, Urine 1.029 1.005 - 1.030   pH 5.5 5.0 - 8.0   Glucose, UA NEGATIVE NEGATIVE mg/dL   Hgb urine dipstick TRACE (A) NEGATIVE   Bilirubin Urine NEGATIVE NEGATIVE   Ketones, ur NEGATIVE NEGATIVE mg/dL   Protein, ur NEGATIVE NEGATIVE mg/dL   Urobilinogen, UA 1.0 0.0 - 1.0 mg/dL   Nitrite NEGATIVE NEGATIVE   Leukocytes, UA NEGATIVE NEGATIVE  Urine microscopic-add on     Status: None   Collection Time: 02/02/15 12:52 AM  Result Value Ref Range   WBC, UA 0-2 <3 WBC/hpf   RBC / HPF 0-2 <3 RBC/hpf   Urine-Other MUCOUS PRESENT      RADIOLOGY RESULTS: Ct Renal Stone Study  02/02/2015   CLINICAL DATA:  Acute onset of right flank pain. Microhematuria. Initial encounter.  EXAM: CT ABDOMEN AND PELVIS WITHOUT CONTRAST  TECHNIQUE: Multidetector CT imaging of the abdomen and pelvis was performed following the standard protocol without IV contrast.  COMPARISON:  MRI of the lumbar spine performed 07/26/2008, and CT of the lumbar spine performed 04/13/2007  FINDINGS: Minimal left basilar atelectasis is noted.  A 9 mm focus of fat is noted within the right hepatic lobe. A nonspecific 8 mm hypodensity is noted at the left hepatic lobe. The spleen is unremarkable in appearance. The gallbladder is within normal limits.  The pancreas and adrenal glands are unremarkable.  The kidneys are unremarkable in appearance. There is no evidence of hydronephrosis. No renal or ureteral stones are seen. No perinephric stranding is appreciated.  No free fluid is identified. The small bowel is unremarkable in appearance. The stomach is within normal limits. No acute vascular abnormalities are seen.  The appendix is dilated to 8 mm in diameter, with diffuse soft tissue inflammation, concerning for acute appendicitis. There is no evidence of perforation or  abscess formation at this time.  The colon is unremarkable in appearance.  The bladder is mildly distended and grossly unremarkable. The prostate is enlarged, measuring 5.4 cm in transverse dimension. No inguinal lymphadenopathy is seen.  No acute osseous abnormalities are identified. The patient is status post lumbar spinal fusion at L5-S1.  IMPRESSION: 1. Acute appendicitis, with dilatation of the appendix to 8 mm in diameter, and surrounding soft tissue inflammation. No evidence of perforation or abscess formation at this time. 2. Nonspecific hypodensities within the liver, one of which appears to contain fat, measuring up to 9 mm. 3. Enlarged prostate noted.  These results were called by telephone at the time of interpretation on 02/02/2015 at 4:00 am to Dr. Rolland Porter, who verbally acknowledged these results.   Electronically Signed   By: Garald Balding M.D.   On: 02/02/2015 04:01    Problem List: Patient Active Problem List   Diagnosis Date Noted  . Appendicitis 02/02/2015    Assessment & Plan: Early appendicitis    Matt B. Hassell Done, MD, [facility] [phone] beeper [phone]  02/02/2015 7:01 AM

## 2015-02-02 NOTE — Op Note (Signed)
OPERATIVE REPORT - LAPAROSCOPIC APPENDECTOMY  Preop diagnosis: Acute appendicitis  Postop diagnosis: Same  Procedure: Laparoscopic appendectomy  Surgeon:  Velora Heckler, MD, FACS  Anesthesia: General endotracheal  Estimated blood loss: Minimal  Preparation: Chlora-prep  Complications: None  Indications:  Patient is 44 yo BM who presented to the ER with abdominal pain.  CT abd positive for acute appendicitis.  Now for lap appendectomy.  Procedure:  Patient is brought to the operating room and placed in a supine position on the operating room table. Following administration of general anesthesia, a time out was held and the patient's name and procedure is confirmed. Patient is then prepped and draped in the usual strict aseptic fashion.  After ascertaining that an adequate level of anesthesia has been achieved, a peri-umbilical incision is made with a #15 blade. Dissection is carried down to the fascia. Fascia is incised in the midline and the peritoneal cavity is entered cautiously. A #0-vicryl pursestring suture is placed in the fascia. An Hassan cannula is introduced under direct vision and secured with the pursestring suture. The abdomen is insufflated with carbon dioxide. The laparoscope is introduced and the abdomen is explored. Operative ports are placed in the right upper quadrant and left lower quadrant. The appendix is identified. The mesoappendix is divided with the harmonic scalpel. Dissection is carried down to the base of the appendix. The base of the appendix is dissected out clearing the junction with the cecal wall. Using an Endo-GIA stapler, the base of the appendix is transected at the junction with the cecal wall. There is good approximation of tissue along the staple line. There is good hemostasis along the staple line. The appendix is placed into an endo-catch bag and withdrawn through the umbilical port. The #0-vicryl pursestring suture is tied securely.  Right lower  quadrant is irrigated with warm saline which is evacuated. Good hemostasis is noted. Ports are removed under direct vision. Good hemostasis is noted at the port sites. Pneumoperitoneum is released.  Skin incisions are anesthetized with local anesthetic. Wounds are closed with interrupted 4-0 Monocryl subcuticular sutures. Wounds are washed and dried and benzoin and Steri-Strips are applied. Dressings are applied. The patient is awakened from anesthesia and brought to the recovery room. The patient tolerated the procedure well.  Velora Heckler, MD, [facility] Office: [phone]

## 2015-02-02 NOTE — Interval H&P Note (Signed)
History and Physical Interval Note:  02/02/2015 9:00 AM  Joseph Reyes  has presented today for surgery, with the diagnosis of appendicitis.  The various methods of treatment have been discussed with the patient and family. After consideration of risks, benefits and other options for treatment, the patient has consented to    Procedure(s): APPENDECTOMY LAPAROSCOPIC (N/A) as a surgical intervention .    The patient's history has been reviewed, patient examined, no change in status, stable for surgery.  I have reviewed the patient's chart and labs.  Questions were answered to the patient's satisfaction.    Velora Heckler, MD, [facility] Office: [phone]    Varshini Arrants Judie Petit

## 2015-02-02 NOTE — Anesthesia Procedure Notes (Signed)
Procedure Name: Intubation Date/Time: 02/02/2015 9:31 AM Performed by: Joseph Reyes D Pre-anesthesia Checklist: Patient identified, Emergency Drugs available, Suction available and Patient being monitored Patient Re-evaluated:Patient Re-evaluated prior to inductionOxygen Delivery Method: Circle System Utilized Preoxygenation: Pre-oxygenation with 100% oxygen Intubation Type: IV induction Ventilation: Mask ventilation without difficulty Laryngoscope Size: Mac and 4 Grade View: Grade II Tube type: Oral Tube size: 7.5 mm Number of attempts: 1 Airway Equipment and Method: Stylet and Oral airway Placement Confirmation: ETT inserted through vocal cords under direct vision,  positive ETCO2 and breath sounds checked- equal and bilateral Secured at: 22 cm Tube secured with: Tape Dental Injury: Teeth and Oropharynx as per pre-operative assessment

## 2015-02-02 NOTE — ED Provider Notes (Signed)
CSN: [id number]     Arrival date & time 02/01/15  2210 History  This chart was scribed for Joseph Albe, MD by Doreatha Martin, ED Scribe. This patient was seen in room WA10/WA10 and the patient's care was started at 2:52 AM.     Chief Complaint  Patient presents with  . Abdominal Pain   The history is provided by the patient. No language interpreter was used.    HPI Comments: Joseph Reyes is a 44 y.o. male who presents to the Emergency Department complaining of moderate, intermittent, dull RLQ abdominal pain that radiates occasionally to the lower back (with movement) onset a week ago and worsened this morning by being constant. He states associated decreased appetite. He reports sharp pains with palpation. He points to his RUQ just under his rib cage as to where he has pain. Pt states that pain is relieved when sitting forward and worsened with deep breathing, ambulation and sudden movement. Pt is a non-smoker and a non-drinker. He works for [employer]. He denies recent changes, injuries, heavy lifting, suspicious food intake. He also denies nausea, vomiting, fever, diarrhea, constipation (last BM 2 days ago).   No current PCP or regular medications.   Past Medical History  Diagnosis Date  . Hypercholesteremia    Past Surgical History  Procedure Laterality Date  . Back surgery     Family History  Problem Relation Age of Onset  . Hypertension Father    Social History  Substance Use Topics  . Smoking status: Never Smoker   . Smokeless tobacco: None  . Alcohol Use: No  employed  Review of Systems  Constitutional: Positive for appetite change. Negative for fever.  Gastrointestinal: Positive for abdominal pain. Negative for nausea, vomiting, diarrhea and constipation.  All other systems reviewed and are negative.  Allergies  Ibuprofen and Percocet  Home Medications   Prior to Admission medications   Medication Sig Start Date End Date Taking? Authorizing Provider  cetirizine  (ZYRTEC) 10 MG tablet Take 10 mg by mouth daily.   Yes Historical Provider, MD  doxycycline (VIBRAMYCIN) 100 MG capsule Take 1 capsule (100 mg total) by mouth 2 (two) times daily. One po bid x 10 days Patient not taking: Reported on 02/02/2015 10/30/14   [name], PA-C  methocarbamol (ROBAXIN) 500 MG tablet Take 1 tablet (500 mg total) by mouth 2 (two) times daily. Patient not taking: Reported on 02/02/2015 08/13/12   [name], PA-C  traMADol (ULTRAM) 50 MG tablet Take 1 tablet (50 mg total) by mouth every 6 (six) hours as needed for pain. Patient not taking: Reported on 02/02/2015 08/13/12   [name], PA-C   BP 137/87 mmHg  Pulse 90  Temp(Src) 97.8 F (36.6 C) (Oral)  Resp 16  Ht 6' 4.5" (1.943 m)  Wt 300 lb (136.079 kg)  BMI 36.05 kg/m2  SpO2 96%  Vital signs normal   Physical Exam  Constitutional: He is oriented to person, place, and time. He appears well-developed and well-nourished.  Non-toxic appearance. He does not appear ill. No distress.  HENT:  Head: Normocephalic and atraumatic.  Right Ear: External ear normal.  Left Ear: External ear normal.  Nose: Nose normal. No mucosal edema or rhinorrhea.  Mouth/Throat: Oropharynx is clear and moist and mucous membranes are normal. No dental abscesses or uvula swelling.  Eyes: Conjunctivae and EOM are normal. Pupils are equal, round, and reactive to light.  Neck: Normal range of motion and full passive range of motion without pain.  Neck supple.  Cardiovascular: Normal rate, regular rhythm and normal heart sounds.  Exam reveals no gallop and no friction rub.   No murmur heard. Pulmonary/Chest: Effort normal and breath sounds normal. No respiratory distress. He has no wheezes. He has no rhonchi. He has no rales. He exhibits no tenderness and no crepitus.  Abdominal: Soft. Normal appearance. He exhibits no distension. There is tenderness. There is no rebound and no guarding.    No CVA tenderness. No RUQ tenderness. Pain in  the right mid lateral abdomen. Most tender in the RLQ. Decreased bowel sounds.   Musculoskeletal: Normal range of motion. He exhibits no edema or tenderness.  Moves all extremities well.   Neurological: He is alert and oriented to person, place, and time. He has normal strength. No cranial nerve deficit.  Skin: Skin is warm, dry and intact. No rash noted. No erythema. No pallor.  Psychiatric: He has a normal mood and affect. His speech is normal and behavior is normal. His mood appears not anxious.  Nursing note and vitals reviewed.  ED Course  Procedures (including critical care time)  Medications  0.9 %  sodium chloride infusion ( Intravenous New Bag/Given 02/02/15 0349)  fentaNYL (SUBLIMAZE) injection 50 mcg (50 mcg Intravenous Given 02/02/15 0348)  Ampicillin-Sulbactam (UNASYN) 3 g in sodium chloride 0.9 % 100 mL IVPB (0 g Intravenous Stopped 02/02/15 0642)    DIAGNOSTIC STUDIES: Oxygen Saturation is 96% on RA, adequate by my interpretation.    COORDINATION OF CARE: 3:01 AM Discussed treatment plan with pt at bedside and pt agreed to plan.   04:00 Radiologist called about his CT scan and  states he has acute appendicitis. Pt was given IV unasyn.   Patient was given the results of his CT scan and need for admission and surgery.  05:33 Dr Daphine Deutscher, will see this am and arrange his surgery.  Labs Review Results for orders placed or performed during the hospital encounter of 02/01/15  Lipase, blood  Result Value Ref Range   Lipase 17 (L) 22 - 51 U/L  Comprehensive metabolic panel  Result Value Ref Range   Sodium 142 135 - 145 mmol/L   Potassium 4.2 3.5 - 5.1 mmol/L   Chloride 106 101 - 111 mmol/L   CO2 28 22 - 32 mmol/L   Glucose, Bld 110 (H) 65 - 99 mg/dL   BUN 15 6 - 20 mg/dL   Creatinine, Ser 4.40 0.61 - 1.24 mg/dL   Calcium 9.7 8.9 - 10.2 mg/dL   Total Protein 8.1 6.5 - 8.1 g/dL   Albumin 4.3 3.5 - 5.0 g/dL   AST 22 15 - 41 U/L   ALT 30 17 - 63 U/L   Alkaline  Phosphatase 56 38 - 126 U/L   Total Bilirubin 1.1 0.3 - 1.2 mg/dL   GFR calc non Af Amer >60 >60 mL/min   GFR calc Af Amer >60 >60 mL/min   Anion gap 8 5 - 15  CBC  Result Value Ref Range   WBC 5.4 4.0 - 10.5 K/uL   RBC 4.96 4.22 - 5.81 MIL/uL   Hemoglobin 15.2 13.0 - 17.0 g/dL   HCT 72.5 36.6 - 44.0 %   MCV 91.1 78.0 - 100.0 fL   MCH 30.6 26.0 - 34.0 pg   MCHC 33.6 30.0 - 36.0 g/dL   RDW 34.7 42.5 - 95.6 %   Platelets 273 150 - 400 K/uL  Urinalysis, Routine w reflex microscopic (not at [facility])  Result Value Ref  Range   Color, Urine YELLOW YELLOW   APPearance CLEAR CLEAR   Specific Gravity, Urine 1.029 1.005 - 1.030   pH 5.5 5.0 - 8.0   Glucose, UA NEGATIVE NEGATIVE mg/dL   Hgb urine dipstick TRACE (A) NEGATIVE   Bilirubin Urine NEGATIVE NEGATIVE   Ketones, ur NEGATIVE NEGATIVE mg/dL   Protein, ur NEGATIVE NEGATIVE mg/dL   Urobilinogen, UA 1.0 0.0 - 1.0 mg/dL   Nitrite NEGATIVE NEGATIVE   Leukocytes, UA NEGATIVE NEGATIVE  Urine microscopic-add on  Result Value Ref Range   WBC, UA 0-2 <3 WBC/hpf   RBC / HPF 0-2 <3 RBC/hpf   Urine-Other MUCOUS PRESENT    Laboratory interpretation all normal   Imaging Review Ct Renal Stone Study  02/02/2015   CLINICAL DATA:  Acute onset of right flank pain. Microhematuria. Initial encounter.  EXAM: CT ABDOMEN AND PELVIS WITHOUT CONTRAST  TECHNIQUE: Multidetector CT imaging of the abdomen and pelvis was performed following the standard protocol without IV contrast.  COMPARISON:  MRI of the lumbar spine performed 07/26/2008, and CT of the lumbar spine performed 04/13/2007  FINDINGS: Minimal left basilar atelectasis is noted.  A 9 mm focus of fat is noted within the right hepatic lobe. A nonspecific 8 mm hypodensity is noted at the left hepatic lobe. The spleen is unremarkable in appearance. The gallbladder is within normal limits. The pancreas and adrenal glands are unremarkable.  The kidneys are unremarkable in appearance. There is no evidence of  hydronephrosis. No renal or ureteral stones are seen. No perinephric stranding is appreciated.  No free fluid is identified. The small bowel is unremarkable in appearance. The stomach is within normal limits. No acute vascular abnormalities are seen.  The appendix is dilated to 8 mm in diameter, with diffuse soft tissue inflammation, concerning for acute appendicitis. There is no evidence of perforation or abscess formation at this time.  The colon is unremarkable in appearance.  The bladder is mildly distended and grossly unremarkable. The prostate is enlarged, measuring 5.4 cm in transverse dimension. No inguinal lymphadenopathy is seen.  No acute osseous abnormalities are identified. The patient is status post lumbar spinal fusion at L5-S1.  IMPRESSION: 1. Acute appendicitis, with dilatation of the appendix to 8 mm in diameter, and surrounding soft tissue inflammation. No evidence of perforation or abscess formation at this time. 2. Nonspecific hypodensities within the liver, one of which appears to contain fat, measuring up to 9 mm. 3. Enlarged prostate noted.  These results were called by telephone at the time of interpretation on 02/02/2015 at 4:00 am to Dr. Devoria Reyes, who verbally acknowledged these results.   Electronically Signed   By: Roanna Raider M.D.   On: 02/02/2015 04:01     EKG Interpretation None      MDM   Final diagnoses:  Acute appendicitis, unspecified acute appendicitis type    Plan admission  Joseph Albe, MD, FACEP   I personally performed the services described in this documentation, which was scribed in my presence. The recorded information has been reviewed and considered.  Joseph Albe, MD, Concha Pyo, MD 02/02/15 [phone]

## 2015-02-02 NOTE — Anesthesia Preprocedure Evaluation (Addendum)
Anesthesia Evaluation  Patient identified by MRN, date of birth, ID band Patient awake    Reviewed: Allergy & Precautions, H&P , NPO status , Patient's Chart, lab work & pertinent test results  Airway Mallampati: II  TM Distance: >3 FB Neck ROM: full    Dental no notable dental hx. (+) Dental Advisory Given, Teeth Intact   Pulmonary neg pulmonary ROS,  breath sounds clear to auscultation  Pulmonary exam normal       Cardiovascular Exercise Tolerance: Good negative cardio ROS Normal cardiovascular examRhythm:regular Rate:Normal     Neuro/Psych negative neurological ROS  negative psych ROS   GI/Hepatic negative GI ROS, Neg liver ROS,   Endo/Other  negative endocrine ROS  Renal/GU negative Renal ROS  negative genitourinary   Musculoskeletal   Abdominal   Peds  Hematology negative hematology ROS (+)   Anesthesia Other Findings   Reproductive/Obstetrics negative OB ROS                             Anesthesia Physical Anesthesia Plan  ASA: I and emergent  Anesthesia Plan: General   Post-op Pain Management:    Induction: Intravenous, Rapid sequence and Cricoid pressure planned  Airway Management Planned: Oral ETT  Additional Equipment:   Intra-op Plan:   Post-operative Plan: Extubation in OR  Informed Consent: I have reviewed the patients History and Physical, chart, labs and discussed the procedure including the risks, benefits and alternatives for the proposed anesthesia with the patient or authorized representative who has indicated his/her understanding and acceptance.   Dental Advisory Given  Plan Discussed with: CRNA and Surgeon  Anesthesia Plan Comments:        Anesthesia Quick Evaluation

## 2015-02-02 NOTE — Transfer of Care (Signed)
Immediate Anesthesia Transfer of Care Note  Patient: Joseph Reyes  Procedure(s) Performed: Procedure(s): APPENDECTOMY LAPAROSCOPIC (N/A)  Patient Location: PACU  Anesthesia Type:General  Level of Consciousness: awake, alert  and oriented  Airway & Oxygen Therapy: Patient Spontanous Breathing and Patient connected to face mask oxygen  Post-op Assessment: Report given to RN and Post -op Vital signs reviewed and stable  Post vital signs: Reviewed and stable  Last Vitals:  Filed Vitals:   02/02/15 0759  BP: 121/75  Pulse: 64  Temp: 36.7 C  Resp: 20    Complications: No apparent anesthesia complications

## 2015-02-02 NOTE — ED Notes (Signed)
Report to 5W called. Will hold pt in ED as going to OR at 8:45.

## 2015-02-03 ENCOUNTER — Encounter (HOSPITAL_COMMUNITY): Payer: Self-pay | Admitting: Surgery

## 2015-02-03 ENCOUNTER — Encounter: Payer: Self-pay | Admitting: General Surgery

## 2015-02-03 MED ORDER — TRAMADOL HCL 50 MG PO TABS: 50.0000 mg | ORAL_TABLET | Freq: Four times a day (QID) | ORAL | Status: DC | PRN

## 2015-02-03 MED ORDER — ACETAMINOPHEN 325 MG PO TABS: 650.0000 mg | ORAL_TABLET | Freq: Four times a day (QID) | ORAL | Status: DC | PRN

## 2015-02-03 NOTE — Discharge Instructions (Signed)
Laparoscopic Appendectomy °Care After °Refer to this sheet in the next few weeks. These instructions provide you with information on caring for yourself after your procedure. Your caregiver may also give you more specific instructions. Your treatment has been planned according to current medical practices, but problems sometimes occur. Call your caregiver if you have any problems or questions after your procedure. °HOME CARE INSTRUCTIONS °· Do not drive while taking narcotic pain medicines. °· Use stool softener if you become constipated from your pain medicines. °· Change your bandages (dressings) as directed. °· Keep your wounds clean and dry. You may wash the wounds gently with soap and water. Gently pat the wounds dry with a clean towel. °· Do not take baths, swim, or use hot tubs for 10 days, or as instructed by your caregiver. °· Only take over-the-counter or prescription medicines for pain, discomfort, or fever as directed by your caregiver. °· You may continue your normal diet as directed. °· Do not lift more than 10 pounds (4.5 kg) or play contact sports for 3 weeks, or as directed. °· Slowly increase your activity after surgery. °· Take deep breaths to avoid getting a lung infection (pneumonia). °SEEK MEDICAL CARE IF: °· You have redness, swelling, or increasing pain in your wounds. °· You have pus coming from your wounds. °· You have drainage from a wound that lasts longer than 1 day. °· You notice a bad smell coming from the wounds or dressing. °· Your wound edges break open after stitches (sutures) have been removed. °· You notice increasing pain in the shoulders (shoulder strap areas) or near your shoulder blades. °· You develop dizzy episodes or fainting while standing. °· You develop shortness of breath. °· You develop persistent nausea or vomiting. °· You cannot control your bowel functions or lose your appetite. °· You develop diarrhea. °SEEK IMMEDIATE MEDICAL CARE IF:  °· You have a fever. °· You  develop a rash. °· You have difficulty breathing or sharp pains in your chest. °· You develop any reaction or side effects to medicines given. °MAKE SURE YOU: °· Understand these instructions. °· Will watch your condition. °· Will get help right away if you are not doing well or get worse. °Document Released: 06/10/2005 Document Revised: 09/02/2011 Document Reviewed: 12/18/2010 °ExitCare® Patient Information ©2015 [employer]. This information is not intended to replace advice given to you by your health care provider. Make sure you discuss any questions you have with your health care provider. °[facility] ______[facility] °LAPAROSCOPIC SURGERY: POST OP INSTRUCTIONS °Always review your discharge instruction sheet given to you by the facility where your surgery was performed. °IF YOU HAVE DISABILITY OR FAMILY LEAVE FORMS, YOU MUST BRING THEM TO THE OFFICE FOR PROCESSING.   °DO NOT GIVE THEM TO YOUR DOCTOR. ° °1. A prescription for pain medication may be given to you upon discharge.  Take your pain medication as prescribed, if needed.  If narcotic pain medicine is not needed, then you may take acetaminophen (Tylenol) or ibuprofen (Advil) as needed. °2. Take your usually prescribed medications unless otherwise directed. °3. If you need a refill on your pain medication, please contact your pharmacy.  They will contact our office to request authorization. Prescriptions will not be filled after 5pm or on week-ends. °4. You should follow a light diet the first few days after arrival home, such as soup and crackers, etc.  Be sure to include lots of fluids daily. °5. Most patients will experience some swelling and bruising   in the area of the incisions.  Ice packs will help.  Swelling and bruising can take several days to resolve.  °6. It is common to experience some constipation if taking pain medication after surgery.  Increasing fluid intake and taking a stool softener (such as Colace) will usually help or  prevent this problem from occurring.  A mild laxative (Milk of Magnesia or Miralax) should be taken according to package instructions if there are no bowel movements after 48 hours. °7. Unless discharge instructions indicate otherwise, you may remove your bandages 24-48 hours after surgery, and you may shower at that time.  You may have steri-strips (small skin tapes) in place directly over the incision.  These strips should be left on the skin for 7-10 days.  If your surgeon used skin glue on the incision, you may shower in 24 hours.  The glue will flake off over the next 2-3 weeks.  Any sutures or staples will be removed at the office during your follow-up visit. °8. ACTIVITIES:  You may resume regular (light) daily activities beginning the next day--such as daily self-care, walking, climbing stairs--gradually increasing activities as tolerated.  You may have sexual intercourse when it is comfortable.  Refrain from any heavy lifting or straining until approved by your doctor. °a. You may drive when you are no longer taking prescription pain medication, you can comfortably wear a seatbelt, and you can safely maneuver your car and apply brakes. °b. RETURN TO WORK:  __________________________________________________________ °9. You should see your doctor in the office for a follow-up appointment approximately 2-3 weeks after your surgery.  Make sure that you call for this appointment within a day or two after you arrive home to insure a convenient appointment time. °10. OTHER INSTRUCTIONS: __________________________________________________________________________________________________________________________ __________________________________________________________________________________________________________________________ °WHEN TO CALL YOUR DOCTOR: °1. Fever over 101.0 °2. Inability to urinate °3. Continued bleeding from incision. °4. Increased pain, redness, or drainage from the incision. °5. Increasing  abdominal pain ° °The clinic staff is available to answer your questions during regular business hours.  Please don’t hesitate to call and ask to speak to one of the nurses for clinical concerns.  If you have a medical emergency, go to the nearest emergency room or call 911.  A surgeon from [facility] is always on call at the hospital. °[address], [address], [place], [place]  [address] ? [address] [address], [place], [place]   [address] °[phone] ? [phone] ? FAX [phone] °Web site: [email or web address] ° °

## 2015-02-03 NOTE — Progress Notes (Signed)
Vital Signs WNL.  Patient is passing gas, walking in halls and tolerating regular diet without difficulty.  Discussed discharge instructions with patient and spouse.  Patient verbalizes understanding.  D/C to home with written Rx.Marland Kitchen

## 2015-02-03 NOTE — Discharge Summary (Signed)
Physician Discharge Summary  Patient ID: Joseph Reyes MRN: [id number] DOB/AGE: 03/02/1971 44 y.o.  Admit date: 02/01/2015 Discharge date: 02/03/2015  Admission Diagnoses:  Acute appendicitis Hx of back surgery  Discharge Diagnoses:  Acute appendicitis Hx of back surgery   Principal Problem:   Appendicitis Active Problems:   Appendicitis, acute   Acute appendicitis   PROCEDURES:  laparoscopic appendectomy,02/02/15, Dr. Laurin Coder Course: KOLBI Joseph Reyes is an 44 y.o. male who presented early this morning with complaints of right lower quadrant pain. CT done without contrast for stones showed stranding around a plump appendix. Pt seen and admitted by Dr. Daphine Deutscher.  He went to the OR later that morning after admit with surgery by Dr.Gerkin.   He did well was eating a regular diet, tolerating pain and want tramadol for pain at home.  He was discharged home the following AM after surgery.  Follow up in Clinic 3 weeks, office will arrange.  Condition on d/c:  Improved  Disposition: 01-Home or Self Care     Medication List    STOP taking these medications        doxycycline 100 MG capsule  Commonly known as:  VIBRAMYCIN     methocarbamol 500 MG tablet  Commonly known as:  ROBAXIN      TAKE these medications        acetaminophen 325 MG tablet  Commonly known as:  TYLENOL  Take 2 tablets (650 mg total) by mouth every 6 (six) hours as needed.     cetirizine 10 MG tablet  Commonly known as:  ZYRTEC  Take 10 mg by mouth daily.     traMADol 50 MG tablet  Commonly known as:  ULTRAM  Take 1-2 tablets (50-100 mg total) by mouth every 6 (six) hours as needed for moderate pain.           Follow-up Information    Follow up with [facility] [place].   Why:  The office should call you with an appointment.  If you don't hear something by Tuesday next week call and tell them you need an appointment for 3 weeks with the [facility] clinic.     Contact information:   [address] [place] [address] [phone]      Signed: Sherrie George 02/03/2015, 1:28 PM

## 2015-02-03 NOTE — Progress Notes (Signed)
1 Day Post-Op  Subjective: He looks great wants tramadol for pain and would like to go home.  Eating regular breakfast, up and walking.  Objective: Vital signs in last 24 hours: Temp:  [97.7 F (36.5 C)-98.8 F (37.1 C)] 98.1 F (36.7 C) (08/12 0549) Pulse Rate:  [61-113] 97 (08/12 0549) Resp:  [14-20] 16 (08/12 0549) BP: (127-157)/(67-86) 132/82 mmHg (08/12 0549) SpO2:  [94 %-100 %] 97 % (08/12 0549) Last BM Date: 02/01/15 Afebrile, VSS WBC is normal Intake/Output from previous day: 08/11 0701 - 08/12 0700 In: 3507.5 [P.O.:1200; I.V.:2307.5] Out: 5100 [Urine:5100] Intake/Output this shift:    General appearance: alert, cooperative and no distress GI: soft, sore, tolerating diet, sites look good.  Lab Results:   Recent Labs  02/01/15 2323 02/02/15 1250  WBC 5.4 7.2  HGB 15.2 14.3  HCT 45.2 41.8  PLT 273 237    BMET  Recent Labs  02/01/15 2323 02/02/15 1250  NA 142  --   K 4.2  --   CL 106  --   CO2 28  --   GLUCOSE 110*  --   BUN 15  --   CREATININE 1.19 0.99  CALCIUM 9.7  --    PT/INR No results for input(s): LABPROT, INR in the last 72 hours.   Recent Labs Lab 02/01/15 2323  AST 22  ALT 30  ALKPHOS 56  BILITOT 1.1  PROT 8.1  ALBUMIN 4.3     Lipase     Component Value Date/Time   LIPASE 17* 02/01/2015 2323     Studies/Results: Ct Renal Stone Study  02/02/2015   CLINICAL DATA:  Acute onset of right flank pain. Microhematuria. Initial encounter.  EXAM: CT ABDOMEN AND PELVIS WITHOUT CONTRAST  TECHNIQUE: Multidetector CT imaging of the abdomen and pelvis was performed following the standard protocol without IV contrast.  COMPARISON:  MRI of the lumbar spine performed 07/26/2008, and CT of the lumbar spine performed 04/13/2007  FINDINGS: Minimal left basilar atelectasis is noted.  A 9 mm focus of fat is noted within the right hepatic lobe. A nonspecific 8 mm hypodensity is noted at the left hepatic lobe. The spleen is unremarkable in  appearance. The gallbladder is within normal limits. The pancreas and adrenal glands are unremarkable.  The kidneys are unremarkable in appearance. There is no evidence of hydronephrosis. No renal or ureteral stones are seen. No perinephric stranding is appreciated.  No free fluid is identified. The small bowel is unremarkable in appearance. The stomach is within normal limits. No acute vascular abnormalities are seen.  The appendix is dilated to 8 mm in diameter, with diffuse soft tissue inflammation, concerning for acute appendicitis. There is no evidence of perforation or abscess formation at this time.  The colon is unremarkable in appearance.  The bladder is mildly distended and grossly unremarkable. The prostate is enlarged, measuring 5.4 cm in transverse dimension. No inguinal lymphadenopathy is seen.  No acute osseous abnormalities are identified. The patient is status post lumbar spinal fusion at L5-S1.  IMPRESSION: 1. Acute appendicitis, with dilatation of the appendix to 8 mm in diameter, and surrounding soft tissue inflammation. No evidence of perforation or abscess formation at this time. 2. Nonspecific hypodensities within the liver, one of which appears to contain fat, measuring up to 9 mm. 3. Enlarged prostate noted.  These results were called by telephone at the time of interpretation on 02/02/2015 at 4:00 am to Dr. Devoria Albe, who verbally acknowledged these results.   Electronically Signed  By: Roanna Raider M.D.   On: 02/02/2015 04:01    Medications: . heparin  5,000 Units Subcutaneous 3 times per day    Assessment/Plan Acute appendicitis S/p laparoscopic appendectomy,02/02/15, Dr. Gerrit Friends    Plan:  Discharge home this Am.  Follow up in Clinic 3 weeks.      Joseph Reyes 02/03/2015

## 2015-10-10 ENCOUNTER — Encounter: Payer: Self-pay | Admitting: *Deleted

## 2015-10-10 ENCOUNTER — Telehealth: Payer: Self-pay | Admitting: *Deleted

## 2015-10-10 NOTE — Telephone Encounter (Signed)
Pre-Visit Call completed with patient and chart updated.   Pre-Visit Info documented in Specialty Comments under [employer].    

## 2015-10-10 NOTE — Telephone Encounter (Signed)
Unable to reach patient at time of pre-visit call. Left message for patient to return call when available.  

## 2015-10-11 ENCOUNTER — Encounter: Payer: Self-pay | Admitting: Physician Assistant

## 2015-10-11 ENCOUNTER — Encounter: Payer: Self-pay | Admitting: *Deleted

## 2015-10-11 ENCOUNTER — Ambulatory Visit (INDEPENDENT_AMBULATORY_CARE_PROVIDER_SITE_OTHER): Payer: PRIVATE HEALTH INSURANCE | Admitting: Physician Assistant

## 2015-10-11 VITALS — BP 150/98 | HR 90 | Temp 98.0°F | Ht 76.5 in | Wt 321.4 lb

## 2015-10-11 DIAGNOSIS — Z8679 Personal history of other diseases of the circulatory system: Secondary | ICD-10-CM | POA: Diagnosis not present

## 2015-10-11 DIAGNOSIS — G8929 Other chronic pain: Secondary | ICD-10-CM | POA: Diagnosis not present

## 2015-10-11 DIAGNOSIS — K5900 Constipation, unspecified: Secondary | ICD-10-CM

## 2015-10-11 DIAGNOSIS — R1031 Right lower quadrant pain: Secondary | ICD-10-CM | POA: Diagnosis not present

## 2015-10-11 LAB — COMPREHENSIVE METABOLIC PANEL
ALK PHOS: 60 U/L (ref 39–117)
ALT: 34 U/L (ref 0–53)
AST: 22 U/L (ref 0–37)
Albumin: 4.4 g/dL (ref 3.5–5.2)
BILIRUBIN TOTAL: 1.1 mg/dL (ref 0.2–1.2)
BUN: 14 mg/dL (ref 6–23)
CALCIUM: 9.8 mg/dL (ref 8.4–10.5)
CO2: 32 mEq/L (ref 19–32)
Chloride: 105 mEq/L (ref 96–112)
Creatinine, Ser: 1.05 mg/dL (ref 0.40–1.50)
GFR: 99.26 mL/min (ref >60.00)
GLUCOSE: 91 mg/dL (ref 70–99)
POTASSIUM: 4.2 meq/L (ref 3.5–5.1)
Sodium: 142 mEq/L (ref 135–145)
TOTAL PROTEIN: 7.8 g/dL (ref 6.0–8.3)

## 2015-10-11 LAB — POC URINALSYSI DIPSTICK (AUTOMATED)
Bilirubin, UA: NEGATIVE
Glucose, UA: NEGATIVE
Ketones, UA: NEGATIVE
Leukocytes, UA: NEGATIVE
Nitrite, UA: NEGATIVE
PROTEIN UA: NEGATIVE
UROBILINOGEN UA: 0.2
pH, UA: 5

## 2015-10-11 LAB — URINALYSIS, MICROSCOPIC ONLY
RBC / HPF: NONE SEEN (ref 0–?)
WBC, UA: NONE SEEN (ref 0–?)

## 2015-10-11 NOTE — Patient Instructions (Signed)
Your EKG looks great today. Again after an ablation you can have those small flutters that can happen from time to time but should only last a second and resolve.  If anything is worsening, then I would want to do a Holter Monitor study.  Please go to the lab for blood work. I will call with results. Follow the bowel regimen below and start a daily probiotic. You will be contacted by GI for further assessment.  I encourage you to increase hydration and the amount of fiber in your diet.  Start a daily probiotic (Align, Culturelle, Digestive Advantage, etc.). If no bowel movement within 24 hours, take 2 Tbs of Milk of Magnesia in a 4 oz glass of warmed prune juice every 2-3 days to help promote bowel movement. If no results within 24 hours, then repeat above regimen, adding a Dulcolax stool softener to regimen. If this does not promote a bowel movement, please call the office.

## 2015-10-11 NOTE — Progress Notes (Signed)
Patient presents to clinic today to establish care.  Acute Concerns: Patient endorses RLQ pain with constipation since appendectomy 01/2015. Endorses certain foods or beverages will cause him to feel nauseated with episodes of non-bloody emesis. Emesis occurs every few days. Endorses significant constipation. Endorses bowel movement once every 3 days. Endorses stools are hard sometimes. Denies blood in the stool. Denies tenesmus. Wife notes patient with darker urine. Denies painful urination, hematuria or history of kidney stone.   Patient with history of short PR syndrome with arrhythmia s/p successful ablation. Endorses every once in a while feeling an extra beat but denies palpitations. Denies SOB, chest pain, lightheadedness or dizziness. Does endorses labile BP without diagnosis or treatment of hypertension.    Health Maintenance: Immunizations -- Tetanus in 2011.   Past Medical History  Diagnosis Date  . Hypercholesteremia   . Appendicitis     Past Surgical History  Procedure Laterality Date  . Back surgery      Ruptured discs -- Spinal fusion  . Laparoscopic appendectomy N/A 02/02/2015    Procedure: APPENDECTOMY LAPAROSCOPIC;  Surgeon: Darnell Level, MD;  Location: [facility];  Service: General;  Laterality: N/A;  . Ablation  1993    For Palpitations    Current Outpatient Prescriptions on File Prior to Visit  Medication Sig Dispense Refill  . cetirizine (ZYRTEC) 10 MG tablet Take 10 mg by mouth daily as needed.      No current facility-administered medications on file prior to visit.    Allergies  Allergen Reactions  . Ibuprofen Swelling  . Oxycodone Rash  . Percocet [Oxycodone-Acetaminophen] Itching    Family History  Problem Relation Age of Onset  . Hypertension Father   . Diabetes Father   . Healthy Son 5  . Healthy Daughter 28    Social History   Social History  . Marital Status: Married    Spouse Name: N/A  . Number of Children: 2  . Years of Education:  N/A   Occupational History  . Marketing     [employer]   Social History Main Topics  . Smoking status: Never Smoker   . Smokeless tobacco: Never Used  . Alcohol Use: No  . Drug Use: No  . Sexual Activity: Not on file   Other Topics Concern  . Not on file   Social History Narrative   Review of Systems  Constitutional: Negative for fever and weight loss.  Eyes: Negative for blurred vision, double vision, photophobia and pain.  Respiratory: Negative for shortness of breath.   Cardiovascular: Negative for chest pain and palpitations.  Gastrointestinal: Positive for nausea, vomiting, abdominal pain and constipation. Negative for heartburn, diarrhea, blood in stool and melena.  Genitourinary: Negative for dysuria, urgency, frequency, hematuria and flank pain.  Musculoskeletal: Negative for falls.  Neurological: Negative for dizziness, loss of consciousness and headaches.    BP 150/98 mmHg  Pulse 90  Temp(Src) 98 F (36.7 C) (Oral)  Ht 6' 4.5" (1.943 m)  Wt 321 lb 6.4 oz (145.786 kg)  BMI 38.62 kg/m2  SpO2 98%  Physical Exam  Constitutional: He is oriented to person, place, and time and well-developed, well-nourished, and in no distress.  HENT:  Head: Normocephalic and atraumatic.  Eyes: Conjunctivae are normal.  Neck: Neck supple.  Cardiovascular: Normal rate, regular rhythm, normal heart sounds and intact distal pulses.   Pulmonary/Chest: Effort normal and breath sounds normal. No respiratory distress. He has no wheezes. He has no rales. He exhibits no tenderness.  Abdominal:  Soft. Bowel sounds are normal. He exhibits no distension. There is no tenderness.  Lymphadenopathy:    He has no cervical adenopathy.  Neurological: He is alert and oriented to person, place, and time.  Skin: Skin is warm and dry. No rash noted.  Psychiatric: Affect normal.  Vitals reviewed.  Assessment/Plan: History of cardiac arrhythmia EKG obtained revealing NSR. These episodes are  likely a common PVC. None noted on EKG. Reassurance given. Discussed holter monitor or Cardiac assessment but patient declines.   Abdominal pain, chronic, right lower quadrant Suspect combination of constipation and potentially surgical adhesions. Referral to GI placed. Bowel regimen reviewed. Will begin daily probiotic. Will check labs today.

## 2015-10-11 NOTE — Progress Notes (Signed)
Pre visit review using our clinic review tool, if applicable. No additional management support is needed unless otherwise documented below in the visit note. 

## 2015-10-12 DIAGNOSIS — R1031 Right lower quadrant pain: Secondary | ICD-10-CM

## 2015-10-12 DIAGNOSIS — G8929 Other chronic pain: Secondary | ICD-10-CM | POA: Insufficient documentation

## 2015-10-12 DIAGNOSIS — Z8679 Personal history of other diseases of the circulatory system: Secondary | ICD-10-CM | POA: Insufficient documentation

## 2015-10-12 NOTE — Assessment & Plan Note (Signed)
Suspect combination of constipation and potentially surgical adhesions. Referral to GI placed. Bowel regimen reviewed. Will begin daily probiotic. Will check labs today.

## 2015-10-12 NOTE — Assessment & Plan Note (Signed)
EKG obtained revealing NSR. These episodes are likely a common PVC. None noted on EKG. Reassurance given. Discussed holter monitor or Cardiac assessment but patient declines.

## 2016-09-06 ENCOUNTER — Encounter: Payer: Self-pay | Admitting: Physician Assistant

## 2016-09-06 ENCOUNTER — Ambulatory Visit (INDEPENDENT_AMBULATORY_CARE_PROVIDER_SITE_OTHER): Payer: PRIVATE HEALTH INSURANCE | Admitting: Physician Assistant

## 2016-09-06 VITALS — BP 142/98 | HR 88 | Temp 98.3°F | Resp 16 | Ht 76.5 in | Wt 327.0 lb

## 2016-09-06 DIAGNOSIS — R51 Headache: Secondary | ICD-10-CM | POA: Diagnosis not present

## 2016-09-06 DIAGNOSIS — R519 Headache, unspecified: Secondary | ICD-10-CM

## 2016-09-06 DIAGNOSIS — H6983 Other specified disorders of Eustachian tube, bilateral: Secondary | ICD-10-CM

## 2016-09-06 MED ORDER — TIZANIDINE HCL 4 MG PO CAPS: 4.0000 mg | ORAL_CAPSULE | Freq: Three times a day (TID) | ORAL | 0 refills | Status: DC | PRN

## 2016-09-06 NOTE — Progress Notes (Signed)
Pre visit review using our clinic review tool, if applicable. No additional management support is needed unless otherwise documented below in the visit note. 

## 2016-09-06 NOTE — Patient Instructions (Addendum)
Please use the Flonase as directed. 2 sprays in each nostril daily.  Also use some saline nasal spray.  I am hopeful that cutting back on the inflammation in the nasal passages and removing fluid behind the ear to alleviate symptoms.  Tylenol for headache.  Stay well hydrated. Eat a well-balanced diet. Start an Librarian, academicAlign probiotic daily.  Follow-up in 2 weeks.

## 2016-09-06 NOTE — Progress Notes (Signed)
Pt presents to clinic today c/o headache, ear ringing, stomach ache, and nausea x 1.5 weeks.  Patient endorses headache is located temporally (bilateral) and behind his eyes. States headache comes and goes, and is occurring once per day. Describes pain as 7.5/10, dull achy, sometimes throbbing. Reports that eyes water consistently with headaches. Has tried Aleve for headaches w/ relief. States tinnitus began in right ear, described as an intermittent ringing. Has occurred 3 times, and usually begins in the right ear, with the left ear starting shortly after that. Lasts a few minutes every time it occurs. Denies dizziness/vertigo, ear pain, drainage. Notes some ear pressure and some occasional popping.   Describes the stomach ache as intermittent, 3.5/10 severity that does not occur daily. Reports that pain is typically located in his lower abdomen. States that stomach ache goes away upon defecation. Reports that nausea episodes are random, do not occur after eating. States nausea does not last long, comes and goes. Denies vomiting. Is associated with anxiety. States he has been under a lot of stress lately. Reports stress is from both home and work. States he had a increase in stress prior to onset of sx. Reports that symptoms and stress have affected his sleep. States he is tossing and turning throughout the night, but still is able to get about 7 hours of sleep. Additionally admits loose stools that has occurred off and on. Denies diarrhea. Denies painful bowel movements, hematochezia, chest pain, palpitations.  Past Medical History:  Diagnosis Date  . Appendicitis   . Hypercholesteremia     Current Outpatient Prescriptions on File Prior to Visit  Medication Sig Dispense Refill  . cetirizine (ZYRTEC) 10 MG tablet Take 10 mg by mouth daily as needed.      No current facility-administered medications on file prior to visit.     Allergies  Allergen Reactions  . Ibuprofen Swelling  .  Oxycodone Rash  . Percocet [Oxycodone-Acetaminophen] Itching    Family History  Problem Relation Age of Onset  . Hypertension Father   . Diabetes Father   . Healthy Son 79  . Healthy Daughter 24    Social History   Social History  . Marital status: Married    Spouse name: N/A  . Number of children: 2  . Years of education: N/A   Occupational History  . Marketing     [employer]   Social History Main Topics  . Smoking status: Never Smoker  . Smokeless tobacco: Never Used  . Alcohol use No  . Drug use: No  . Sexual activity: Not Asked   Other Topics Concern  . None   Social History Narrative  . None   Review of Systems - See HPI.  All other ROS are negative.  BP (!) 142/98   Pulse 88   Temp 98.3 F (36.8 C) (Oral)   Resp 16   Ht 6' 4.5" (1.943 m)   Wt (!) 327 lb (148.3 kg)   SpO2 98%   BMI 39.29 kg/m   Physical Exam  Constitutional: He is oriented to person, place, and time and well-developed, well-nourished, and in no distress.  HENT:  Head: Normocephalic and atraumatic.  Right Ear: Tympanic membrane is not erythematous and not bulging. A middle ear effusion is present.  Left Ear: Tympanic membrane is not erythematous and not bulging. A middle ear effusion is present.  Nose: Mucosal edema present. Right sinus exhibits no maxillary sinus tenderness and no frontal sinus tenderness. Left sinus exhibits no  maxillary sinus tenderness and no frontal sinus tenderness.  Mouth/Throat: Uvula is midline, oropharynx is clear and moist and mucous membranes are normal.  Eyes: Conjunctivae are normal. Pupils are equal, round, and reactive to light.  Neck: Neck supple.  Cardiovascular: Normal rate, regular rhythm, normal heart sounds and intact distal pulses.   Pulmonary/Chest: Effort normal and breath sounds normal. No respiratory distress. He has no wheezes. He has no rales. He exhibits no tenderness.  Abdominal: Soft. Bowel sounds are normal. He exhibits no  distension and no mass. There is no tenderness. There is no rebound and no guarding.  Neurological: He is alert and oriented to person, place, and time.  Skin: Skin is warm and dry. No rash noted.  Psychiatric: Affect normal.  Vitals reviewed.  Assessment/Plan: 1. Eustachian tube dysfunction, bilateral Start Flonase and Zyrtec. Supportive measures discussed. FU if not improving.  2. Nonintractable episodic headache, unspecified headache type Likely multifactorial -- suspect allergic/sinus component which is being treated with measures listed above. Also a tension component. Will attempt trial of tizanidine. BP elevated. Start DASH diet. FU 2 weeks to reassess. IF BP remains mildly elevated, we will need to start medication.   Piedad ClimesMartin, Madalen Gavin Cody, PA-C

## 2016-09-27 ENCOUNTER — Ambulatory Visit: Payer: PRIVATE HEALTH INSURANCE | Admitting: Physician Assistant

## 2016-10-26 IMAGING — CT CT RENAL STONE PROTOCOL
2 of 3 series · 16 of 35 positions shown, 18 images · non-contrast
Comparison: MRI of the lumbar spine performed 07/26/2008, and CT of
the lumbar spine performed 04/13/2007

CLINICAL DATA: Acute onset of right flank pain. Microhematuria.
Initial encounter.

EXAM:
CT ABDOMEN AND PELVIS WITHOUT CONTRAST
TECHNIQUE: Multidetector CT imaging of the abdomen and pelvis was performed
following the standard protocol without IV contrast.

[Series 3: lung · axial · 0.84mm/px · z∈[+1487,+1577]mm · 13 of 21 slices shown, 15 images]
[im 2/21  soft-tissue]
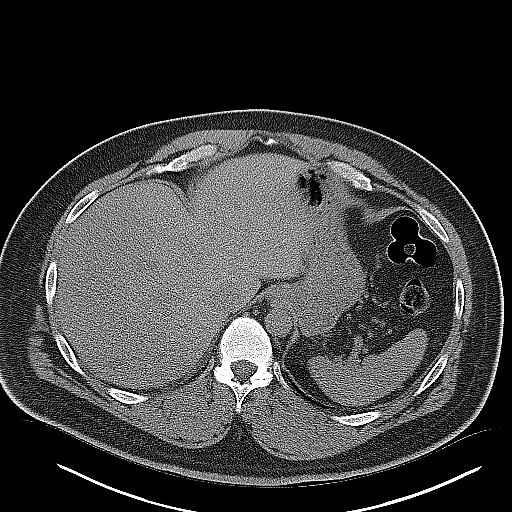
[im 2/21  bone]
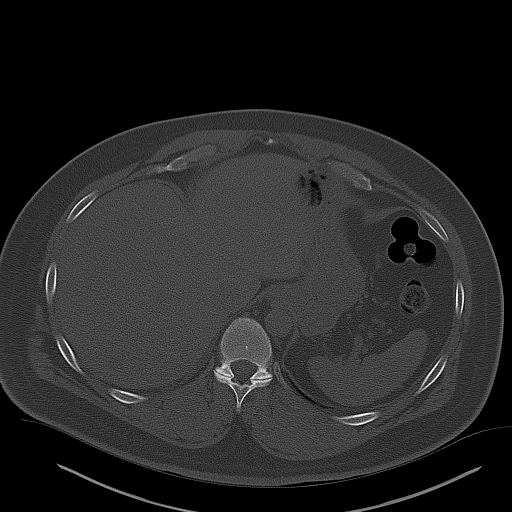
[im 4/21  soft-tissue]
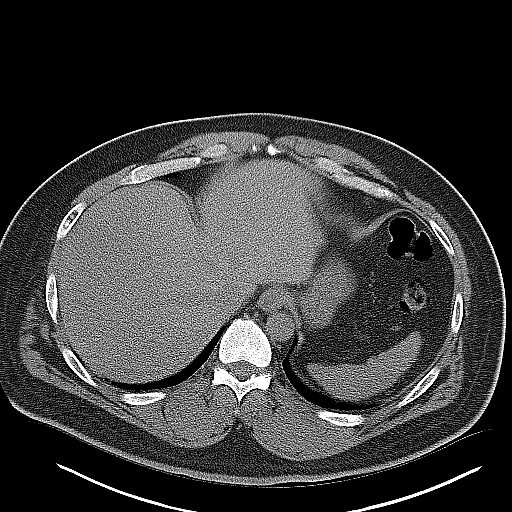
[im 5/21  soft-tissue]
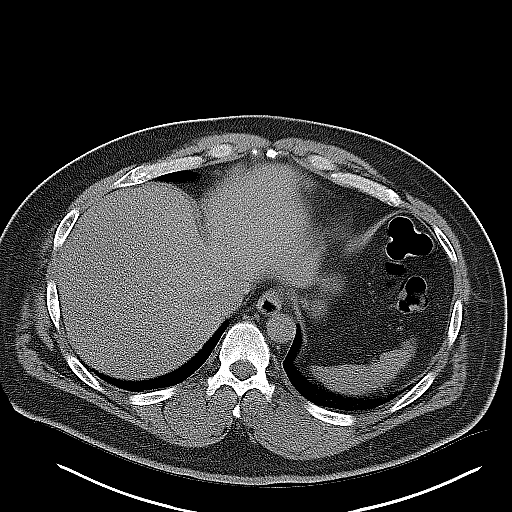
[im 7/21  soft-tissue]
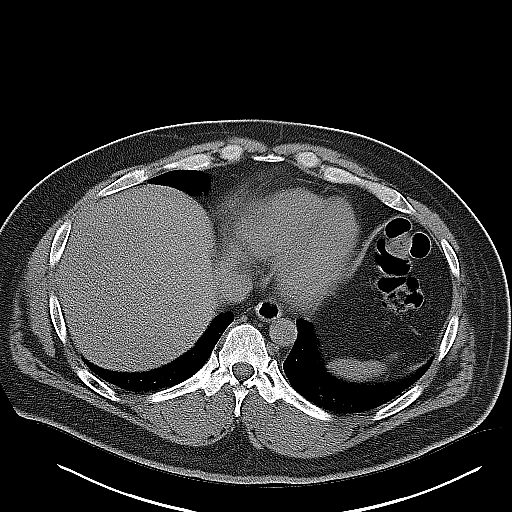
[im 8/21  soft-tissue]
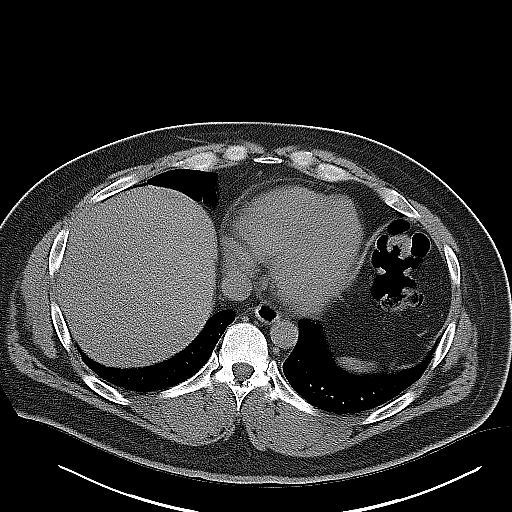
[im 10/21  soft-tissue]
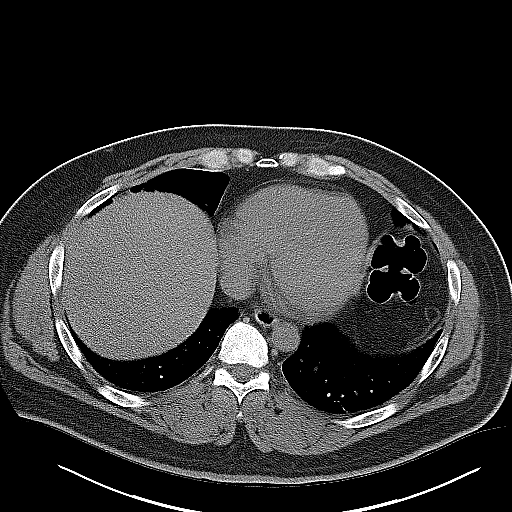
[im 11/21  soft-tissue]
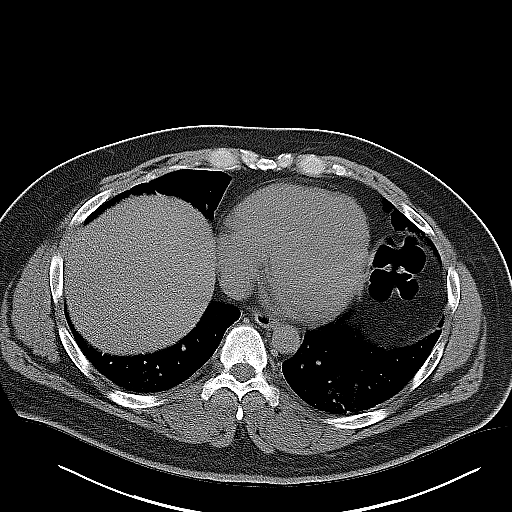
[im 12/21  soft-tissue]
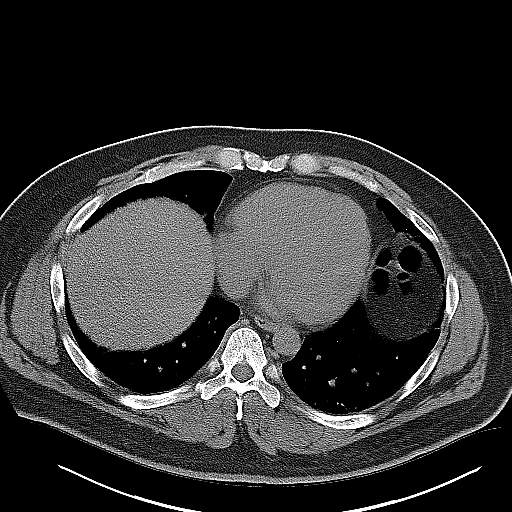
[im 14/21  soft-tissue]
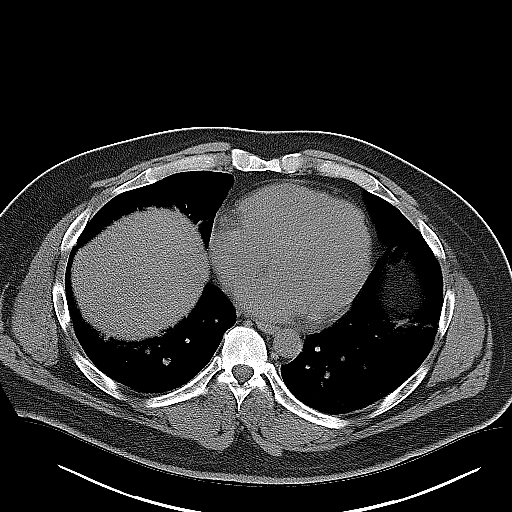
[im 14/21  bone]
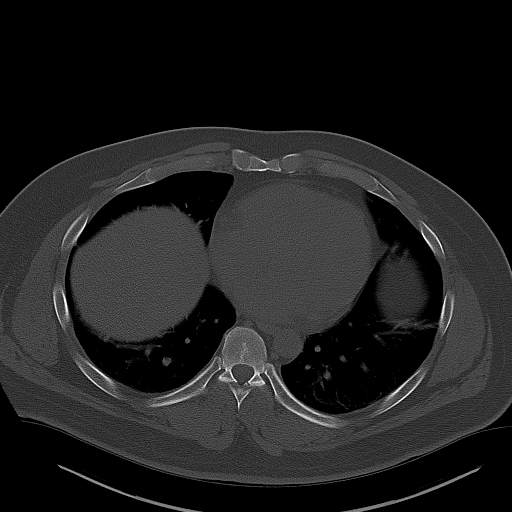
[im 15/21  soft-tissue]
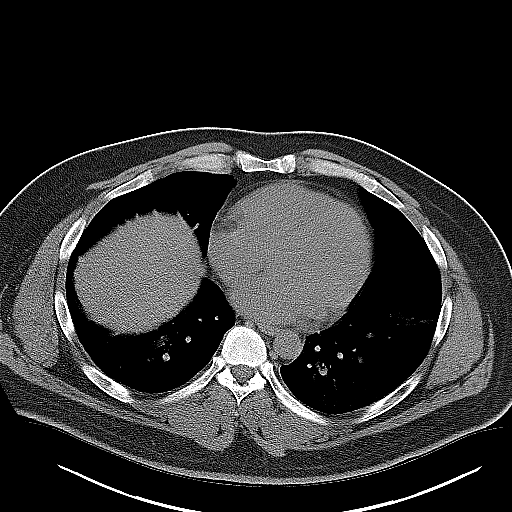
[im 17/21  soft-tissue]
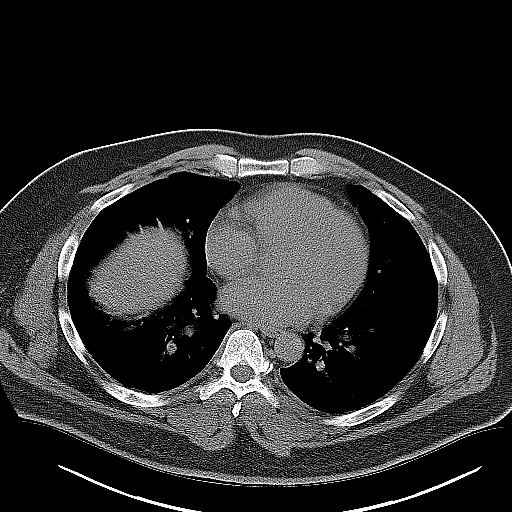
[im 18/21  soft-tissue]
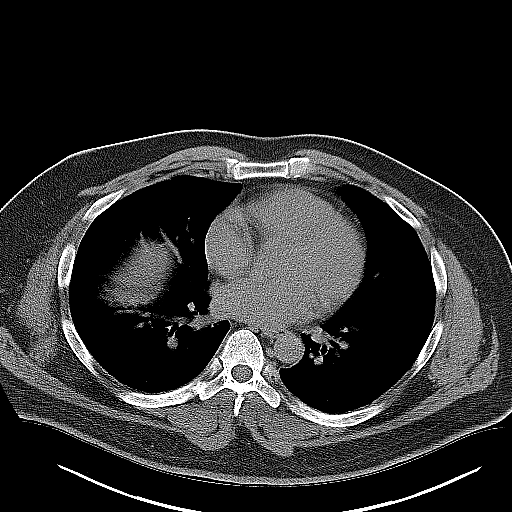
[im 20/21  soft-tissue]
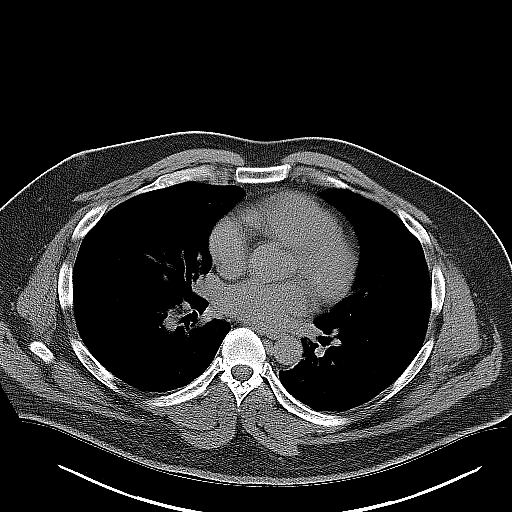

[Series 4: coronal · coronal · 0.78mm/px · 3 of 102 slices shown]
[im 34/102  soft-tissue]
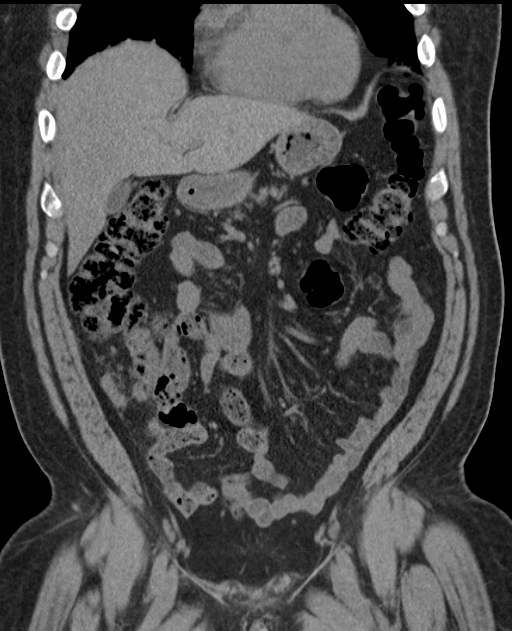
[im 45/102  soft-tissue]
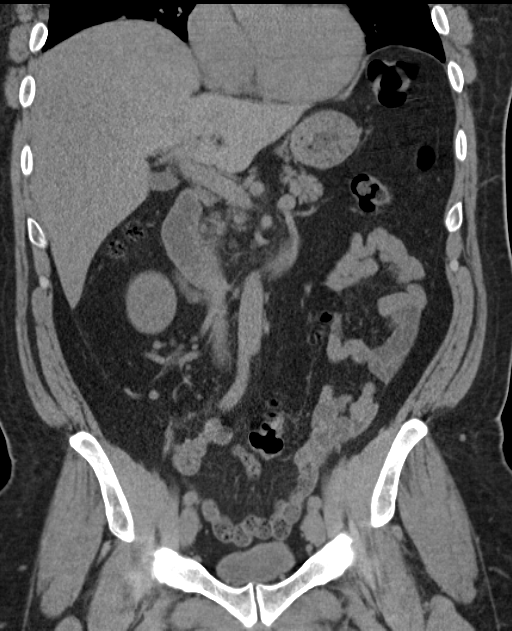
[im 57/102  soft-tissue]
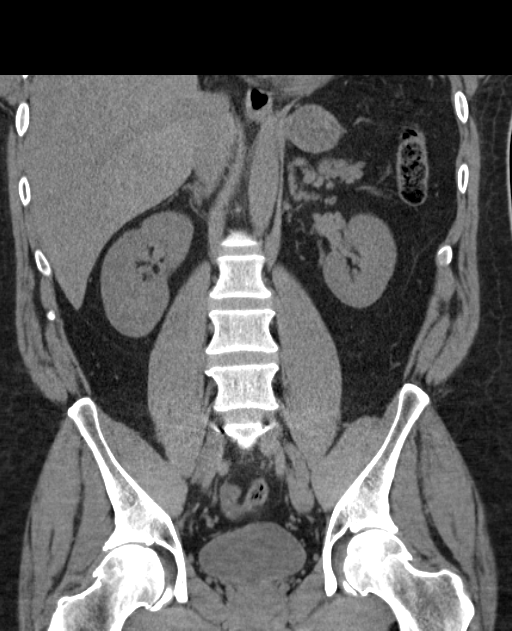

[16 of 35 positions shown; findings below may reference images not displayed]

FINDINGS: Minimal left basilar atelectasis is noted.

A 9 mm focus of fat is noted within the right hepatic lobe. A
nonspecific 8 mm hypodensity is noted at the left hepatic lobe. The
spleen is unremarkable in appearance. The gallbladder is within
normal limits. The pancreas and adrenal glands are unremarkable.

The kidneys are unremarkable in appearance. There is no evidence of
hydronephrosis. No renal or ureteral stones are seen. No perinephric
stranding is appreciated.

No free fluid is identified. The small bowel is unremarkable in
appearance. The stomach is within normal limits. No acute vascular
abnormalities are seen.

The appendix is dilated to 8 mm in diameter, with diffuse soft
tissue inflammation, concerning for acute appendicitis. There is no
evidence of perforation or abscess formation at this time.

The colon is unremarkable in appearance.

The bladder is mildly distended and grossly unremarkable. The
prostate is enlarged, measuring 5.4 cm in transverse dimension. No
inguinal lymphadenopathy is seen.

No acute osseous abnormalities are identified. The patient is status
post lumbar spinal fusion at L5-S1.
IMPRESSION: 1. Acute appendicitis, with dilatation of the appendix to 8 mm in
diameter, and surrounding soft tissue inflammation. No evidence of
perforation or abscess formation at this time.
2. Nonspecific hypodensities within the liver, one of which appears
to contain fat, measuring up to 9 mm.
3. Enlarged prostate noted.

These results were called by telephone at the time of interpretation
on 02/02/2015 at [DATE] to Dr. VAHEED TIGER, who verbally acknowledged
these results.

## 2017-10-02 ENCOUNTER — Emergency Department (HOSPITAL_COMMUNITY)
Admission: EM | Admit: 2017-10-02 | Discharge: 2017-10-02 | Disposition: A | Discharge status: Home / Self Care | Payer: BLUE CROSS/BLUE SHIELD | Attending: Emergency Medicine | Admitting: Emergency Medicine

## 2017-10-02 ENCOUNTER — Other Ambulatory Visit (HOSPITAL_COMMUNITY): Payer: Self-pay | Admitting: Emergency Medicine

## 2017-10-02 ENCOUNTER — Emergency Department (HOSPITAL_COMMUNITY)
Admission: EM | Admit: 2017-10-02 | Discharge: 2017-10-02 | Disposition: A | Discharge status: Home / Self Care | Payer: BLUE CROSS/BLUE SHIELD | Source: Home / Self Care | Attending: Emergency Medicine | Admitting: Emergency Medicine

## 2017-10-02 ENCOUNTER — Other Ambulatory Visit: Payer: Self-pay

## 2017-10-02 DIAGNOSIS — R000 Tachycardia, unspecified: Secondary | ICD-10-CM

## 2017-10-02 DIAGNOSIS — Z79899 Other long term (current) drug therapy: Secondary | ICD-10-CM | POA: Insufficient documentation

## 2017-10-02 DIAGNOSIS — R079 Chest pain, unspecified: Secondary | ICD-10-CM | POA: Diagnosis not present

## 2017-10-02 DIAGNOSIS — I493 Ventricular premature depolarization: Secondary | ICD-10-CM | POA: Insufficient documentation

## 2017-10-02 DIAGNOSIS — I10 Essential (primary) hypertension: Secondary | ICD-10-CM | POA: Diagnosis not present

## 2017-10-02 LAB — CBC WITH DIFFERENTIAL/PLATELET
BASOS PCT: 0 %
Basophils Absolute: 0 10*3/uL (ref 0.0–0.1)
Basophils Absolute: 0 10*3/uL (ref 0.0–0.1)
Basophils Relative: 0 %
EOS PCT: 2 %
Eosinophils Absolute: 0 10*3/uL (ref 0.0–0.7)
Eosinophils Absolute: 0.1 10*3/uL (ref 0.0–0.7)
Eosinophils Relative: 2 %
HCT: 46.7 % (ref 39.0–52.0)
HEMATOCRIT: 46.7 % (ref 39.0–52.0)
Hemoglobin: 15.8 g/dL (ref 13.0–17.0)
Hemoglobin: 15.8 g/dL (ref 13.0–17.0)
Lymphocytes Relative: 45 %
Lymphocytes Relative: 45 %
Lymphs Abs: 2.3 10*3/uL (ref 0.7–4.0)
Lymphs Abs: 2.3 10*3/uL (ref 0.7–4.0)
MCH: 31.2 pg (ref 26.0–34.0)
MCH: 31.2 pg (ref 26.0–34.0)
MCHC: 33.8 g/dL (ref 30.0–36.0)
MCHC: 33.8 g/dL (ref 30.0–36.0)
MCV: 92.1 fL (ref 78.0–100.0)
MCV: 92.1 fL (ref 78.0–100.0)
Monocytes Absolute: 0.4 10*3/uL (ref 0.1–1.0)
Monocytes Absolute: 0.4 10*3/uL (ref 0.1–1.0)
Monocytes Relative: 7 %
Monocytes Relative: 7 %
Neutro Abs: 2.4 10*3/uL (ref 1.7–7.7)
Neutro Abs: 2.4 10*3/uL (ref 1.7–7.7)
Neutrophils Relative %: 46 %
Neutrophils Relative %: 46 %
Platelets: 279 10*3/uL (ref 150–400)
Platelets: 279 10*3/uL (ref 150–400)
RBC: 5.07 MIL/uL (ref 4.22–5.81)
RBC: 5.07 MIL/uL (ref 4.22–5.81)
RDW: 14 % (ref 11.5–15.5)
RDW: 14 % (ref 11.5–15.5)
WBC: 5.1 10*3/uL (ref 4.0–10.5)
WBC: 5.1 10*3/uL (ref 4.0–10.5)

## 2017-10-02 LAB — COMPREHENSIVE METABOLIC PANEL
ALT: 21 U/L (ref 17–63)
AST: 18 U/L (ref 15–41)
Albumin: 4.3 g/dL (ref 3.5–5.0)
Alkaline Phosphatase: 61 U/L (ref 38–126)
Anion gap: 10 (ref 5–15)
BILIRUBIN TOTAL: 0.9 mg/dL (ref 0.3–1.2)
BUN: 15 mg/dL (ref 6–20)
CALCIUM: 9.5 mg/dL (ref 8.9–10.3)
CO2: 25 mmol/L (ref 22–32)
CREATININE: 1.08 mg/dL (ref 0.61–1.24)
Chloride: 105 mmol/L (ref 101–111)
GFR calc Af Amer: >60 mL/min (ref >60)
Glucose, Bld: 106 mg/dL — ABNORMAL HIGH (ref 65–99)
Potassium: 4.4 mmol/L (ref 3.5–5.1)
Sodium: 140 mmol/L (ref 135–145)
TOTAL PROTEIN: 8.1 g/dL (ref 6.5–8.1)

## 2017-10-02 LAB — TROPONIN I

## 2017-10-02 NOTE — ED Provider Notes (Signed)
[facility] EMERGENCY DEPARTMENT Provider Note   CSN: [id number] Arrival date & time: 10/02/17  4098     History   Chief Complaint Chief Complaint  Patient presents with  . Chest Pain    HPI FARREL GUIMOND is a 47 y.o. male.  47 year old male with prior history of short PR syndrome with ablation presents with complaint of "feeling extra heartbeats."  She reports that when he was much younger he had an ablation for an arrhythmia.  Chart review reveals that this was likely a short PR syndrome.  Patient complains of feeling "extra heartbeats" intermittently over the last 3-4 days.  Patient denies any actual long runs of palpitation.  Patient denies any associated shortness of breath, nausea, vomiting, lightheadedness, near syncope, or chest pain.  Patient apparently has discussed this with his primary in the past who suggested that he may be having PVC's.  Patient at that time apparently declined further workup including Holter monitor.  Patient has not been seen by cardiologist in some time.  The history is provided by the patient.  Palpitations   This is a recurrent problem. The current episode started more than 2 days ago. The problem occurs daily. The problem has not changed since onset.Pertinent negatives include no fever, no chest pressure, no syncope, no nausea, no vomiting, no cough and no shortness of breath. He has tried nothing for the symptoms.    Past Medical History:  Diagnosis Date  . Appendicitis   . Hypercholesteremia     Patient Active Problem List   Diagnosis Date Noted  . History of cardiac arrhythmia 10/12/2015  . Abdominal pain, chronic, right lower quadrant 10/12/2015  . Appendicitis, acute 02/02/2015    Past Surgical History:  Procedure Laterality Date  . ABLATION  1993   For Palpitations  . BACK SURGERY     Ruptured discs -- Spinal fusion  . LAPAROSCOPIC APPENDECTOMY N/A 02/02/2015   Procedure: APPENDECTOMY LAPAROSCOPIC;  Surgeon:  Darnell Level, MD;  Location: [facility];  Service: General;  Laterality: N/A;        Home Medications    Prior to Admission medications   Medication Sig Start Date End Date Taking? Authorizing Provider  cetirizine (ZYRTEC) 10 MG tablet Take 10 mg by mouth daily as needed.     [provider]  tiZANidine (ZANAFLEX) 4 MG capsule Take 1 capsule (4 mg total) by mouth 3 (three) times daily as needed for muscle spasms. 09/06/16   Waldon Merl, PA-C    Family History Family History  Problem Relation Age of Onset  . Hypertension Father   . Diabetes Father   . Healthy Son 71  . Healthy Daughter 6    Social History Social History   Tobacco Use  . Smoking status: Never Smoker  . Smokeless tobacco: Never Used  Substance Use Topics  . Alcohol use: No    Alcohol/week: 0.0 oz  . Drug use: No     Allergies   Ibuprofen; Oxycodone; and Percocet [oxycodone-acetaminophen]   Review of Systems Review of Systems  Constitutional: Negative for fever.  Respiratory: Negative for cough and shortness of breath.   Cardiovascular: Positive for palpitations. Negative for syncope.  Gastrointestinal: Negative for nausea and vomiting.  All other systems reviewed and are negative.    Physical Exam Updated Vital Signs BP (!) 161/111 (BP Location: Left Arm)   Pulse 86   Temp 98.2 F (36.8 C) (Oral)   Resp 18   SpO2 96%  Physical Exam  Constitutional: He is oriented to person, place, and time. He appears well-developed and well-nourished. No distress.  HENT:  Head: Normocephalic and atraumatic.  Mouth/Throat: Oropharynx is clear and moist.  Eyes: Pupils are equal, round, and reactive to light. Conjunctivae and EOM are normal.  Neck: Normal range of motion. Neck supple.  Cardiovascular: Normal rate, regular rhythm and normal heart sounds.  Pulmonary/Chest: Effort normal and breath sounds normal. No respiratory distress.  Abdominal: Soft. He exhibits no distension. There is no  tenderness.  Musculoskeletal: Normal range of motion. He exhibits no edema or deformity.  Neurological: He is alert and oriented to person, place, and time.  Skin: Skin is warm and dry.  Psychiatric: He has a normal mood and affect.  Nursing note and vitals reviewed.    ED Treatments / Results  Labs (all labs ordered are listed, but only abnormal results are displayed) Labs Reviewed - No data to display  EKG EKG Interpretation  Date/Time:  Thursday October 02 2017 02:39:39 EDT Ventricular Rate:  87 PR Interval:  124 QRS Duration: 94 QT Interval:  360 QTC Calculation: 433 R Axis:   35 Text Interpretation:  Sinus rhythm with Blocked Premature atrial complexes with occasional Premature ventricular complexes Otherwise normal ECG Confirmed by Kristine RoyalMessick, Charnita Trudel [id number]) on 10/02/2017 7:21:03 AM   Radiology Dg Chest 2 View  Result Date: 10/02/2017 CLINICAL DATA:  Rapid heart rate for 2 days. EXAM: CHEST - 2 VIEW COMPARISON:  Radiographs 12/16/2006 FINDINGS: The cardiomediastinal contours are normal. Minimal right basilar scarring. Pulmonary vasculature is normal. No consolidation, pleural effusion, or pneumothorax. No acute osseous abnormalities are seen. IMPRESSION: Minimal right basilar scarring without acute abnormality. Electronically Signed   By: Rubye OaksMelanie  Ehinger M.D.   On: 10/02/2017 04:59    Procedures Procedures (including critical care time)  Medications Ordered in ED Medications - No data to display   Initial Impression / Assessment and Plan / ED Course  I have reviewed the triage vital signs and the nursing notes.  Pertinent labs & imaging results that were available during my care of the patient were reviewed by me and considered in my medical decision making (see chart for details).     MDM  Screen complete  Patient with reported episodes of "extra heartbeats." Patient's reported symptoms are concurrent with actual PVC's on the monitor. Screening labs - including  CBC,CMP, and Trop - are without significant abnormalities. EKG shows PVC's - but no acute ischemic changes. CXR today is without significant findings. Results do not cross over to this note secondary to system downtime.    Of note, patient's blood pressure is mildly elevated.  Patient understands need for close follow-up regarding his blood pressure.  Patient also understands need for close follow with cardiology regarding his reported PVCs.  Patient may benefit from more extended monitoring such as with a Holter.  Strict return precautions are given and understood.    Final Clinical Impressions(s) / ED Diagnoses   Final diagnoses:  PVC's (premature ventricular contractions)  Hypertension, unspecified type    ED Discharge Orders    None       Wynetta FinesMessick, Jarissa Sheriff C, MD 10/02/17 [phone]

## 2017-10-02 NOTE — ED Triage Notes (Signed)
See downtime  

## 2017-12-11 DIAGNOSIS — I10 Essential (primary) hypertension: Secondary | ICD-10-CM | POA: Diagnosis not present

## 2017-12-11 DIAGNOSIS — Z0000 Encounter for general adult medical examination without abnormal findings: Secondary | ICD-10-CM | POA: Diagnosis not present

## 2017-12-11 DIAGNOSIS — Z136 Encounter for screening for cardiovascular disorders: Secondary | ICD-10-CM | POA: Diagnosis not present

## 2018-01-28 DIAGNOSIS — I4510 Unspecified right bundle-branch block: Secondary | ICD-10-CM | POA: Diagnosis not present

## 2018-01-28 DIAGNOSIS — Z885 Allergy status to narcotic agent status: Secondary | ICD-10-CM | POA: Diagnosis not present

## 2018-01-28 DIAGNOSIS — I471 Supraventricular tachycardia: Secondary | ICD-10-CM | POA: Diagnosis not present

## 2018-01-28 DIAGNOSIS — Z9889 Other specified postprocedural states: Secondary | ICD-10-CM | POA: Diagnosis not present

## 2018-01-28 DIAGNOSIS — Z886 Allergy status to analgesic agent status: Secondary | ICD-10-CM | POA: Diagnosis not present

## 2018-01-30 DIAGNOSIS — I471 Supraventricular tachycardia: Secondary | ICD-10-CM | POA: Diagnosis not present

## 2018-02-11 DIAGNOSIS — I471 Supraventricular tachycardia: Secondary | ICD-10-CM | POA: Diagnosis not present

## 2018-03-25 DIAGNOSIS — Z886 Allergy status to analgesic agent status: Secondary | ICD-10-CM | POA: Diagnosis not present

## 2018-03-25 DIAGNOSIS — I493 Ventricular premature depolarization: Secondary | ICD-10-CM | POA: Diagnosis not present

## 2018-03-25 DIAGNOSIS — I471 Supraventricular tachycardia: Secondary | ICD-10-CM | POA: Diagnosis not present

## 2018-03-25 DIAGNOSIS — Z885 Allergy status to narcotic agent status: Secondary | ICD-10-CM | POA: Diagnosis not present

## 2018-03-25 DIAGNOSIS — Z9889 Other specified postprocedural states: Secondary | ICD-10-CM | POA: Diagnosis not present

## 2018-03-29 DIAGNOSIS — I499 Cardiac arrhythmia, unspecified: Secondary | ICD-10-CM | POA: Diagnosis not present

## 2018-10-02 ENCOUNTER — Other Ambulatory Visit: Payer: Self-pay | Admitting: Cardiology

## 2018-10-02 DIAGNOSIS — I119 Hypertensive heart disease without heart failure: Secondary | ICD-10-CM

## 2018-10-23 ENCOUNTER — Other Ambulatory Visit: Payer: Self-pay | Admitting: Cardiology

## 2018-10-23 ENCOUNTER — Other Ambulatory Visit: Payer: Self-pay

## 2018-10-23 MED ORDER — LISINOPRIL-HYDROCHLOROTHIAZIDE 10-12.5 MG PO TABS: 1.0000 | ORAL_TABLET | Freq: Every day | ORAL | 0 refills | Status: DC

## 2018-10-23 NOTE — Telephone Encounter (Signed)
Please fill

## 2018-10-26 ENCOUNTER — Encounter: Payer: Self-pay | Admitting: Cardiology

## 2018-10-26 NOTE — Progress Notes (Signed)
Subjective:  Primary Physician:  Waldon Merl, PA-C  Patient ID: Joseph Reyes, male    DOB: 01/04/1971, 48 y.o.   MRN: [id number]  This visit type was conducted due to national recommendations for restrictions regarding the COVID-19 Pandemic (e.g. social distancing).  This format is felt to be most appropriate for this patient at this time.  All issues noted in this document were discussed and addressed.  No physical exam was performed (except for noted visual exam findings with Telehealth visits - very limited).  The patient has consented to conduct a Telehealth visit and understands insurance will be billed.   I connected with patient, on 10/27/18  by a video enabled telemedicine application and verified that I am speaking with the correct person using two identifiers.     I discussed the limitations of evaluation and management by telemedicine and the availability of in person appointments. The patient expressed understanding and agreed to proceed.   I have discussed with patient regarding the safety during COVID Pandemic and steps and precautions including social distancing with the patient.    Chief Complaint  Patient presents with  . Hypertension  . Follow-up    HPI: Joseph Reyes  is a 48 y.o. male. Patient has chronic palpitations with PVCs. PVCs have a relatively narrow QRS. Patient has history of ablation for PSVT in 1993 at [facility]. He has history of short PR interval in the past. He has occasional skipped beats. Since the dose of Metoprolol was increased, symptoms have improved markedly. Patient was seen in EP consult at [facility], [place].  He had Zio patch monitoring which revealed PVCs. The PVC burden was less and less 1%. No complaints of dizziness, near-syncope or syncope. Patient denies sudden rapid heart beat, chest pain, PND or orthopnea.  Patient has Hypertension and Hypercholesterolemia. He does not smoke or drink alcohol. He drinks tea occasionally  but no coffee or sodas with caffeine. No history of taking decongestants. No history of thyroid problems. No history of TIA or CVA. He has started riding bicycle for 2-3 miles, couple of times a week.  Past Medical History:  Diagnosis Date  . Appendicitis   . Hypercholesteremia   . Hypertension   . PVC (premature ventricular contraction)     Past Surgical History:  Procedure Laterality Date  . ABLATION  1993   For Palpitations  . BACK SURGERY     Ruptured discs -- Spinal fusion  . LAPAROSCOPIC APPENDECTOMY N/A 02/02/2015   Procedure: APPENDECTOMY LAPAROSCOPIC;  Surgeon: Darnell Level, MD;  Location: [facility];  Service: General;  Laterality: N/A;    Social History   Socioeconomic History  . Marital status: Married    Spouse name: Not on file  . Number of children: 2  . Years of education: Not on file  . Highest education level: Not on file  Occupational History  . Occupation: Chief Financial Officer    Comment: [employer]  Social Needs  . Financial resource strain: Not on file  . Food insecurity:    Worry: Not on file    Inability: Not on file  . Transportation needs:    Medical: Not on file    Non-medical: Not on file  Tobacco Use  . Smoking status: Never Smoker  . Smokeless tobacco: Never Used  Substance and Sexual Activity  . Alcohol use: No    Alcohol/week: 0.0 standard drinks  . Drug use: No  . Sexual activity: Not on file  Lifestyle  .  Physical activity:    Days per week: Not on file    Minutes per session: Not on file  . Stress: Not on file  Relationships  . Social connections:    Talks on phone: Not on file    Gets together: Not on file    Attends religious service: Not on file    Active member of club or organization: Not on file    Attends meetings of clubs or organizations: Not on file    Relationship status: Not on file  . Intimate partner violence:    Fear of current or ex partner: Not on file    Emotionally abused: Not on file    Physically abused:  Not on file    Forced sexual activity: Not on file  Other Topics Concern  . Not on file  Social History Narrative  . Not on file    Current Outpatient Medications on File Prior to Visit  Medication Sig Dispense Refill  . atorvastatin (LIPITOR) 40 MG tablet Take 40 mg by mouth daily.    . cetirizine (ZYRTEC) 10 MG tablet Take 10 mg by mouth daily as needed.     Marland Kitchen lisinopril-hydrochlorothiazide (ZESTORETIC) 10-12.5 MG tablet TAKE 1 TABLET BY MOUTH EVERY DAY 90 tablet 0  . metoprolol succinate (TOPROL-XL) 100 MG 24 hr tablet Take 100 mg by mouth daily.     No current facility-administered medications on file prior to visit.     Review of Systems  Constitutional: Negative for fever.  HENT: Negative for nosebleeds.   Eyes: Negative for blurred vision.  Respiratory: Negative for cough and shortness of breath.   Cardiovascular: Positive for palpitations (occasional).  Gastrointestinal: Negative for abdominal pain, nausea and vomiting.  Genitourinary: Negative for dysuria.  Musculoskeletal: Negative for myalgias.  Skin: Negative for itching and rash.  Neurological: Negative for dizziness, seizures and loss of consciousness.  Psychiatric/Behavioral: The patient is not nervous/anxious.        Objective:  Height 6\' 4"  (1.93 m), weight 290 lb (131.5 kg). Body mass index is 35.3 kg/m.  Physical Exam  Patient is alert and oriented, obese.  He appeared very comfortable talking to me during the visit.  No further physical examination was possible as it was a telemedicine visit.  CARDIAC STUDIES:  Echo- 10/30/2017 1. Left ventricle cavity is normal in size. Moderate concentric hypertrophy of the left ventricle. Borderline normal global wall motion. Left ventricle regional wall motion findings: No wall motion abnormalities. Calculated EF 50%. 2. Trace mitral regurgitation. 3. Trace tricuspid regurgitation. 4. Insignificant pericardial effusion.  Exercise Treadmill Stress Test  05/062019: Indication:PVC, HTN The patient exercised on Bruce protocol for 08:07 min. Patient achieved 10.16 METS and reached HR 156 bpm, which is 88% of maximum age-predicted HR. Stress test terminated due to hip pain. Exercise capacity was normal. HR Response to Exercise: Appropriate. BP Response to Exercise: Normal resting BP- appropriate response. Chest Pain: none. Arrhythmias: none. ST Changes: With peak exercise there was no ST-T changes of ischemia. Overall Impression: Normal stress test.  Assessment & Recommendations:   1. Hypertension with heart disease  2. PVC's (premature ventricular contractions)  3. History of radiofrequency ablation procedure for cardiac arrhythmia  4. Severe Obesity  5. Hypercholesterolemia  Laboratory Exam:  CBC Latest Ref Rng & Units 10/02/2017 10/02/2017 02/02/2015  WBC 4.0 - 10.5 K/uL 5.1 5.1 7.2  Hemoglobin 13.0 - 17.0 g/dL 73.7 10.6 26.9  Hematocrit 39.0 - 52.0 % 46.7 46.7 41.8  Platelets 150 - 400 K/uL  279 279 237   CMP Latest Ref Rng & Units 10/02/2017 10/11/2015 02/02/2015  Glucose 65 - 99 mg/dL 161(W106(H) 91 -  BUN 6 - 20 mg/dL 15 14 -  Creatinine 9.600.61 - 1.24 mg/dL 4.541.08 0.981.05 1.190.99  Sodium 135 - 145 mmol/L 140 142 -  Potassium 3.5 - 5.1 mmol/L 4.4 4.2 -  Chloride 101 - 111 mmol/L 105 105 -  CO2 22 - 32 mmol/L 25 32 -  Calcium 8.9 - 10.3 mg/dL 9.5 9.8 -  Total Protein 6.5 - 8.1 g/dL 8.1 7.8 -  Total Bilirubin 0.3 - 1.2 mg/dL 0.9 1.1 -  Alkaline Phos 38 - 126 U/L 61 60 -  AST 15 - 41 U/L 18 22 -  ALT 17 - 63 U/L 21 34 -   Lipid Panel  No results found for: CHOL, TRIG, HDL, CHOLHDL, VLDL, LDLCALC, LDLDIRECT   Recommendation:  Patient's symptoms of palpitation from PVCs have improved markedly with increased dose of metoprolol. I have advised him to continue the same dose.  He does not check blood pressure at home, I have advised him to get  blood pressure instrument and monitor BP. His pressure was normal during the last visit.  I have  advised him to continue present medications.  Patient has severe obesity.Weight loss was again discussed. He was advised to follow ADA, low-salt, low-cholesterol diet. Patient said that he has started a diet from today. He was encouraged to increase riding the bicycle 4-5 days a week. Patient was advised to avoid caffeine, decongestants or other stimulants.  Return for follow-up after 6 months but call us earlier if there are any cardiac problems or if blood pressure is uncontrolled.  Patient will continue to have all the blood tests at his PCP's office. He told me that he is going to make an appointment for the blood tests.   Earl Manyhandra K Leslieanne Cobarrubias, MD, [facility] 10/27/2018, 12:11 PM [facility] [place] Pager: [phone] Office: [phone] If no answer Cell [phone]

## 2018-10-27 ENCOUNTER — Ambulatory Visit: Payer: BLUE CROSS/BLUE SHIELD | Admitting: Cardiology

## 2018-10-27 ENCOUNTER — Other Ambulatory Visit: Payer: Self-pay

## 2018-10-27 ENCOUNTER — Encounter: Payer: Self-pay | Admitting: Cardiology

## 2018-10-27 VITALS — Ht 76.0 in | Wt 290.0 lb

## 2018-10-27 DIAGNOSIS — Z9889 Other specified postprocedural states: Secondary | ICD-10-CM | POA: Diagnosis not present

## 2018-10-27 DIAGNOSIS — I493 Ventricular premature depolarization: Secondary | ICD-10-CM | POA: Diagnosis not present

## 2018-10-27 DIAGNOSIS — I119 Hypertensive heart disease without heart failure: Secondary | ICD-10-CM | POA: Diagnosis not present

## 2018-10-27 DIAGNOSIS — E7800 Pure hypercholesterolemia, unspecified: Secondary | ICD-10-CM

## 2019-01-09 ENCOUNTER — Other Ambulatory Visit: Payer: Self-pay | Admitting: Cardiology

## 2019-01-20 ENCOUNTER — Other Ambulatory Visit: Payer: Self-pay

## 2019-01-20 MED ORDER — ATORVASTATIN CALCIUM 40 MG PO TABS: 40.0000 mg | ORAL_TABLET | Freq: Every day | ORAL | 1 refills | Status: DC

## 2019-03-31 DIAGNOSIS — I471 Supraventricular tachycardia: Secondary | ICD-10-CM | POA: Diagnosis not present

## 2019-03-31 DIAGNOSIS — I493 Ventricular premature depolarization: Secondary | ICD-10-CM | POA: Diagnosis not present

## 2019-04-13 ENCOUNTER — Other Ambulatory Visit: Payer: Self-pay | Admitting: Cardiology

## 2019-04-26 ENCOUNTER — Ambulatory Visit: Payer: BLUE CROSS/BLUE SHIELD | Admitting: Cardiology

## 2019-05-04 ENCOUNTER — Telehealth: Payer: Self-pay | Admitting: Physician Assistant

## 2019-05-04 NOTE — Telephone Encounter (Signed)
LM asking pt if he still plans on using [facility] P[facility]asant has his pcp and if so schedule a cpe since last OV was 08/2016

## 2019-05-31 DIAGNOSIS — U071 COVID-19: Secondary | ICD-10-CM | POA: Diagnosis not present

## 2019-05-31 DIAGNOSIS — R509 Fever, unspecified: Secondary | ICD-10-CM | POA: Diagnosis not present

## 2019-05-31 DIAGNOSIS — Z9189 Other specified personal risk factors, not elsewhere classified: Secondary | ICD-10-CM | POA: Diagnosis not present

## 2019-05-31 DIAGNOSIS — I10 Essential (primary) hypertension: Secondary | ICD-10-CM | POA: Diagnosis not present

## 2019-06-04 DIAGNOSIS — U071 COVID-19: Secondary | ICD-10-CM | POA: Diagnosis not present

## 2019-06-04 DIAGNOSIS — I10 Essential (primary) hypertension: Secondary | ICD-10-CM | POA: Diagnosis not present

## 2019-06-04 DIAGNOSIS — Z9189 Other specified personal risk factors, not elsewhere classified: Secondary | ICD-10-CM | POA: Diagnosis not present

## 2019-06-15 DIAGNOSIS — U071 COVID-19: Secondary | ICD-10-CM | POA: Diagnosis not present

## 2019-06-15 DIAGNOSIS — Z1159 Encounter for screening for other viral diseases: Secondary | ICD-10-CM | POA: Diagnosis not present

## 2019-06-15 DIAGNOSIS — R0602 Shortness of breath: Secondary | ICD-10-CM | POA: Diagnosis not present

## 2019-06-26 DIAGNOSIS — Z20828 Contact with and (suspected) exposure to other viral communicable diseases: Secondary | ICD-10-CM | POA: Diagnosis not present

## 2019-06-26 IMAGING — DX DG CHEST 2V
2 series · 2 of 2 positions shown · non-contrast
Comparison: Radiographs 12/16/2006

CLINICAL DATA: Rapid heart rate for 2 days.

EXAM:
CHEST - 2 VIEW

[chest pa]
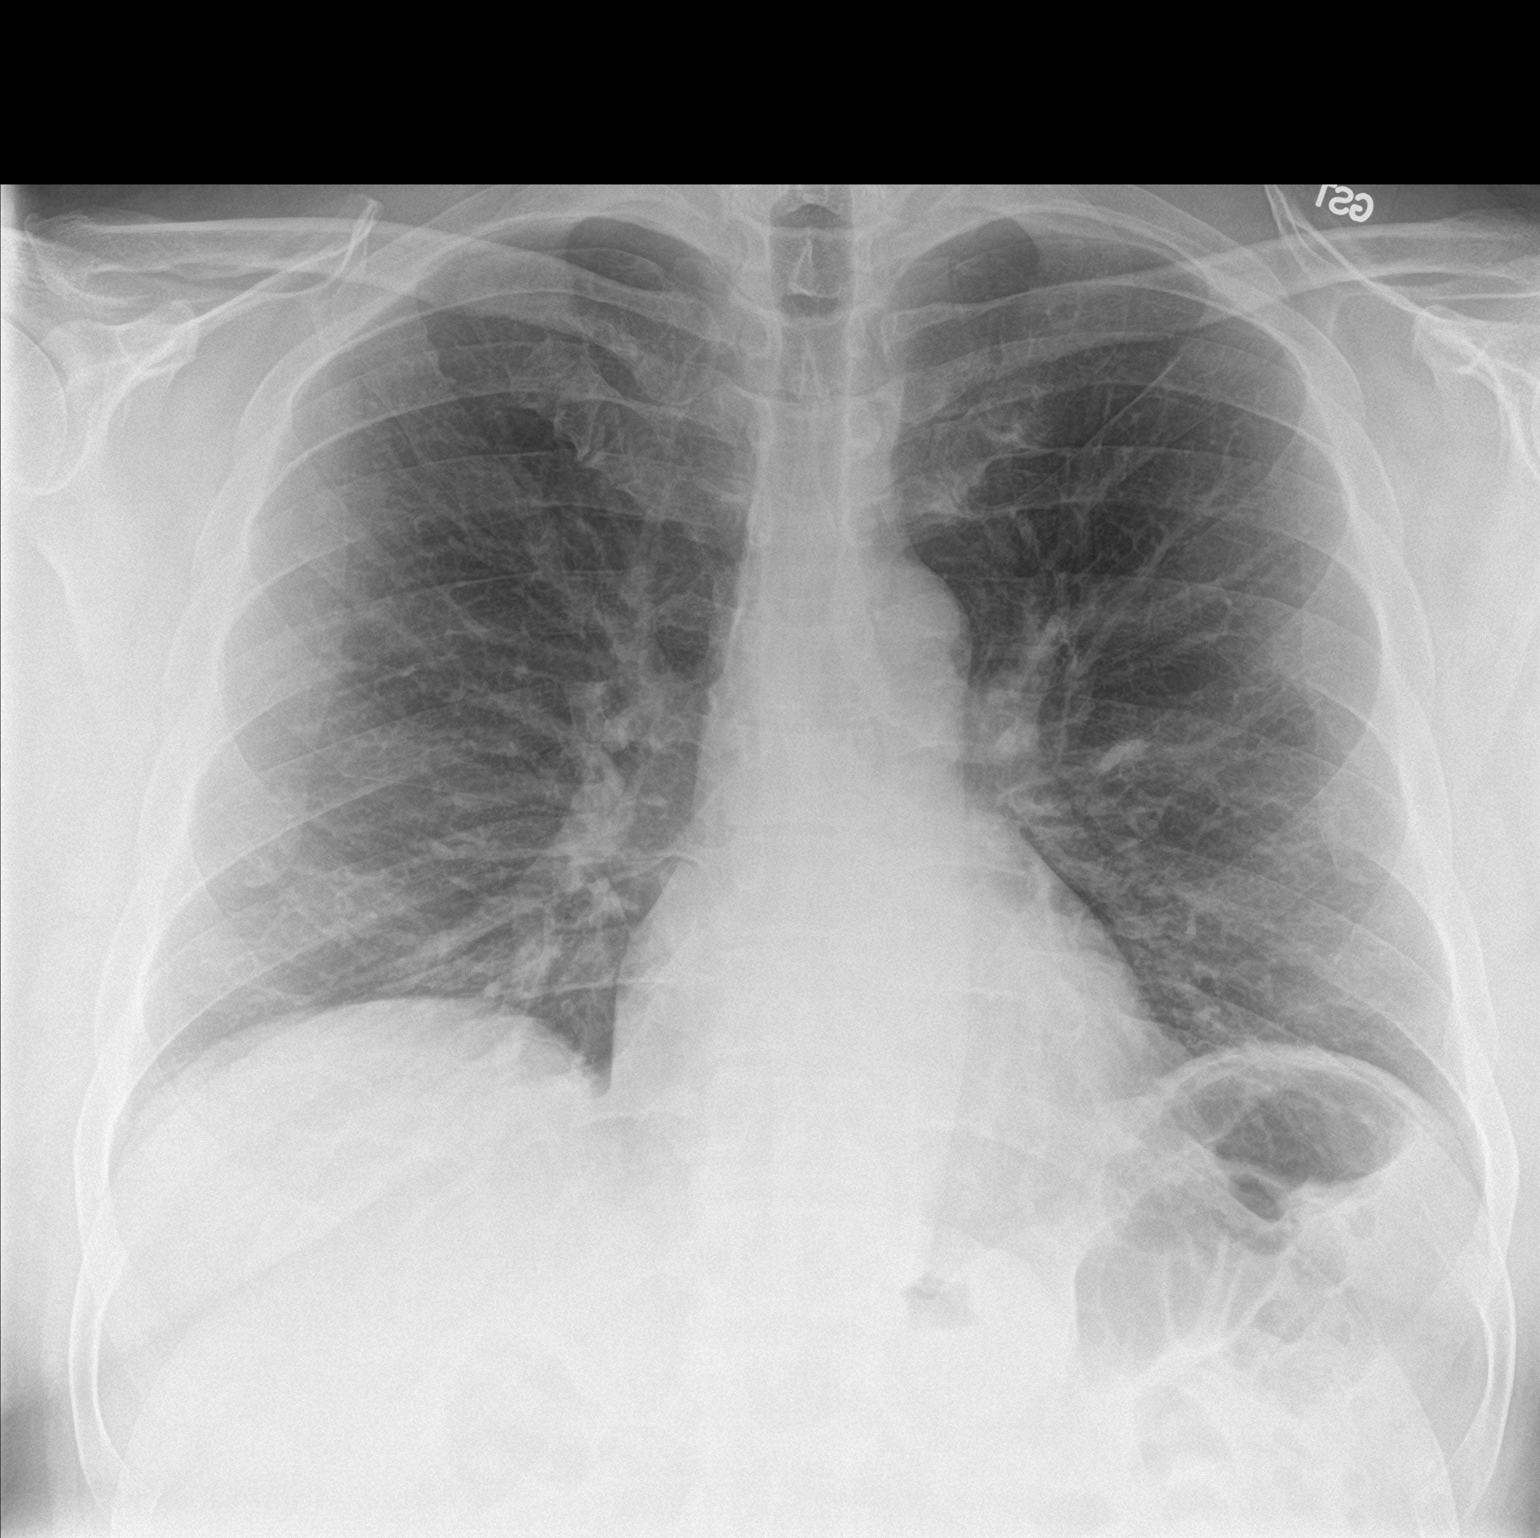

[chest lat]
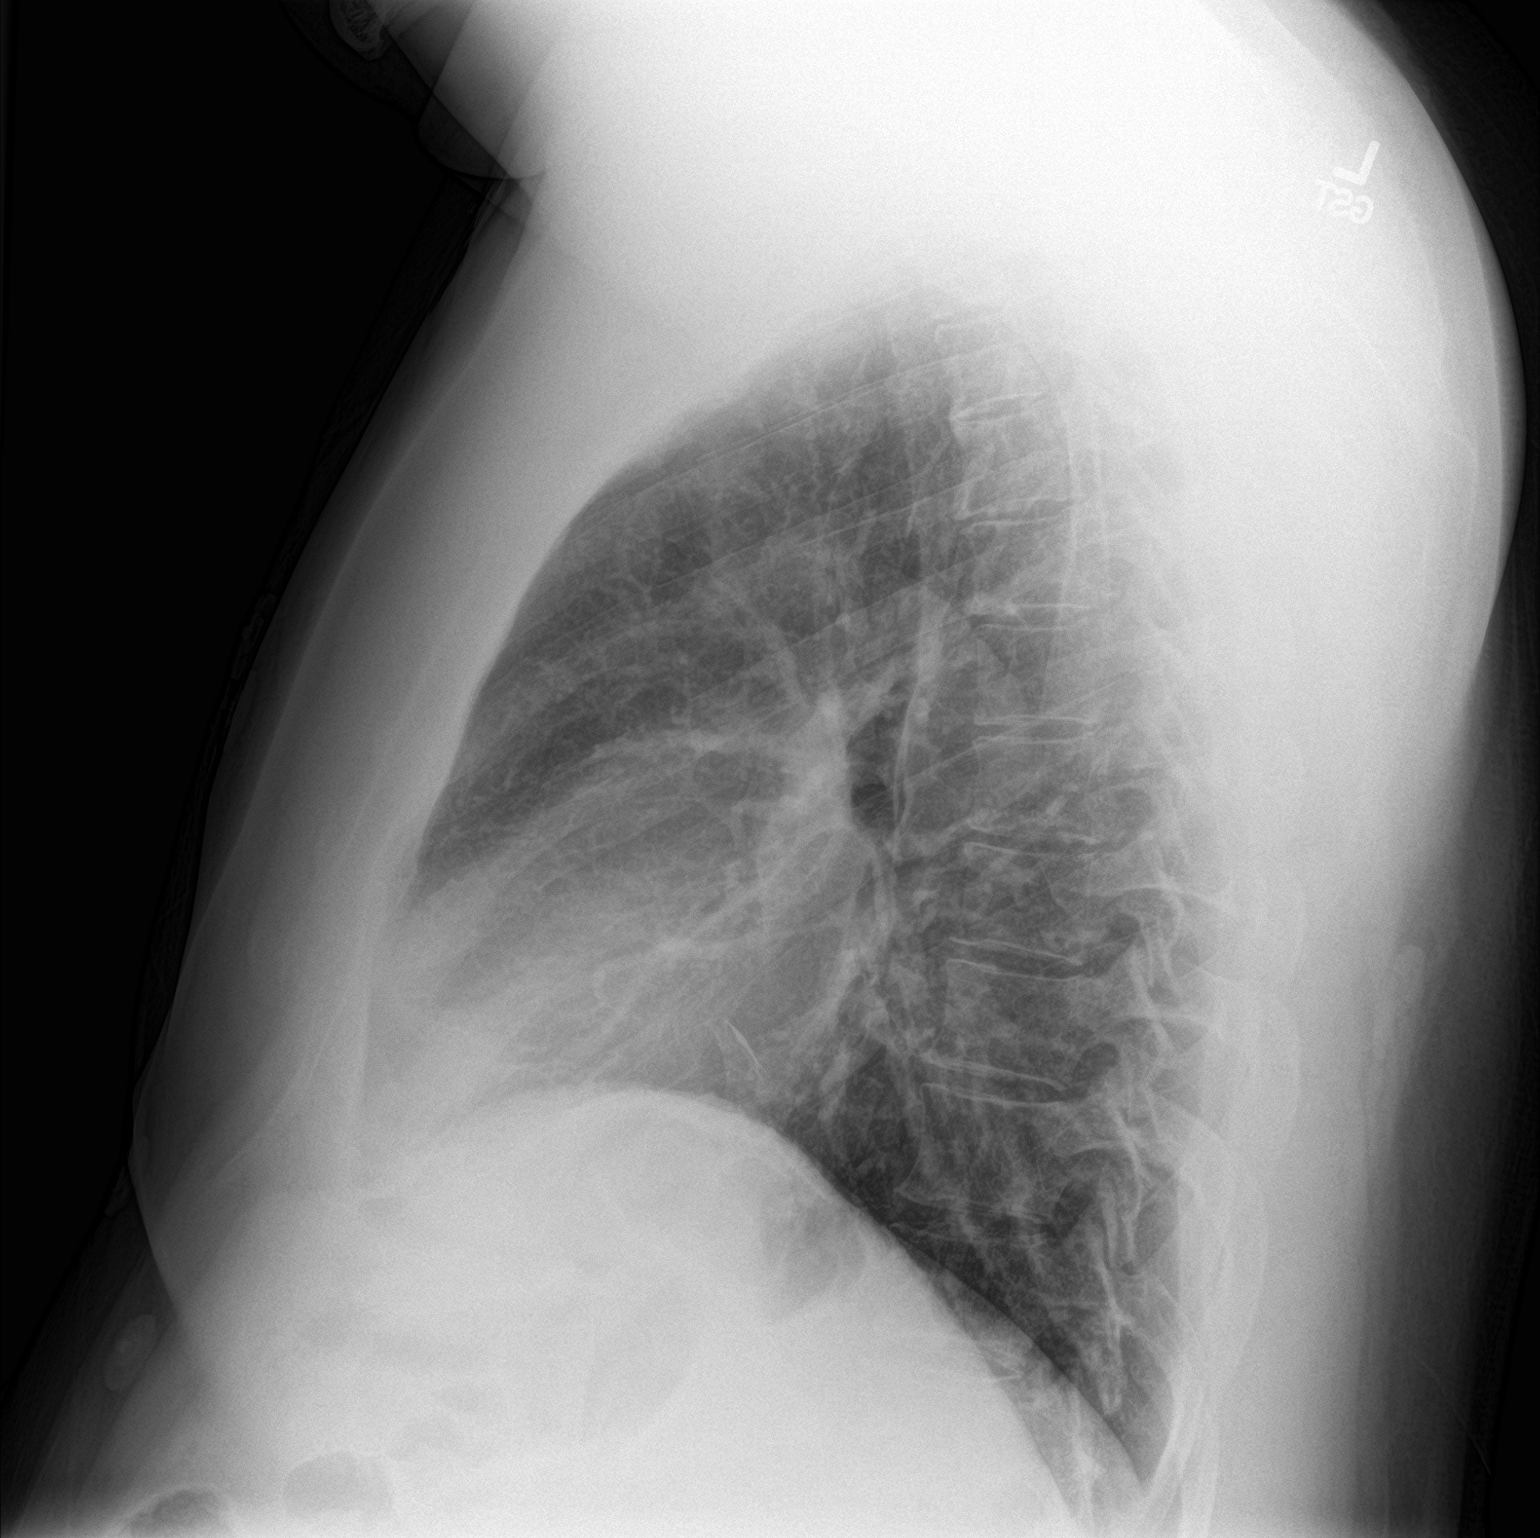

[2 of 2 positions shown; findings below may reference images not displayed]

FINDINGS: The cardiomediastinal contours are normal. Minimal right basilar
scarring. Pulmonary vasculature is normal. No consolidation, pleural
effusion, or pneumothorax. No acute osseous abnormalities are seen.
IMPRESSION: Minimal right basilar scarring without acute abnormality.

## 2019-06-28 DIAGNOSIS — Z20828 Contact with and (suspected) exposure to other viral communicable diseases: Secondary | ICD-10-CM | POA: Diagnosis not present

## 2019-07-11 ENCOUNTER — Other Ambulatory Visit: Payer: Self-pay | Admitting: Cardiology

## 2019-08-03 ENCOUNTER — Other Ambulatory Visit: Payer: Self-pay | Admitting: Cardiology

## 2019-08-04 NOTE — Telephone Encounter (Signed)
refill 

## 2019-10-03 ENCOUNTER — Other Ambulatory Visit: Payer: Self-pay | Admitting: Cardiology

## 2019-10-05 NOTE — Telephone Encounter (Signed)
Patient needs to schedule follow up appt

## 2019-11-09 ENCOUNTER — Other Ambulatory Visit: Payer: Self-pay | Admitting: Cardiology

## 2019-11-22 ENCOUNTER — Other Ambulatory Visit: Payer: Self-pay | Admitting: Cardiology

## 2020-04-04 ENCOUNTER — Other Ambulatory Visit: Payer: Self-pay | Admitting: Cardiology

## 2020-05-03 ENCOUNTER — Other Ambulatory Visit: Payer: Self-pay | Admitting: Cardiology

## 2020-05-27 ENCOUNTER — Other Ambulatory Visit: Payer: Self-pay | Admitting: Cardiology

## 2020-06-07 ENCOUNTER — Other Ambulatory Visit: Payer: Self-pay | Admitting: Cardiology

## 2020-06-27 ENCOUNTER — Other Ambulatory Visit: Payer: Self-pay | Admitting: Cardiology

## 2020-09-03 ENCOUNTER — Other Ambulatory Visit: Payer: Self-pay | Admitting: Cardiology

## 2020-09-04 ENCOUNTER — Other Ambulatory Visit: Payer: Self-pay

## 2020-09-04 MED ORDER — LISINOPRIL-HYDROCHLOROTHIAZIDE 10-12.5 MG PO TABS
1.0000 | ORAL_TABLET | Freq: Every day | ORAL | 1 refills | Status: DC
Start: 2020-09-04 — End: 2020-09-14

## 2020-09-14 ENCOUNTER — Other Ambulatory Visit: Payer: Self-pay

## 2020-09-14 ENCOUNTER — Encounter: Payer: Self-pay | Admitting: Cardiology

## 2020-09-14 ENCOUNTER — Ambulatory Visit: Payer: BLUE CROSS/BLUE SHIELD | Admitting: Cardiology

## 2020-09-14 VITALS — BP 129/75 | HR 57 | Temp 98.3°F | Resp 16 | Ht 76.0 in | Wt 347.2 lb

## 2020-09-14 DIAGNOSIS — E7800 Pure hypercholesterolemia, unspecified: Secondary | ICD-10-CM

## 2020-09-14 DIAGNOSIS — Z9889 Other specified postprocedural states: Secondary | ICD-10-CM

## 2020-09-14 DIAGNOSIS — I493 Ventricular premature depolarization: Secondary | ICD-10-CM

## 2020-09-14 DIAGNOSIS — I10 Essential (primary) hypertension: Secondary | ICD-10-CM

## 2020-09-14 DIAGNOSIS — Z6841 Body Mass Index (BMI) 40.0 and over, adult: Secondary | ICD-10-CM

## 2020-09-14 MED ORDER — LISINOPRIL-HYDROCHLOROTHIAZIDE 10-12.5 MG PO TABS
1.0000 | ORAL_TABLET | Freq: Every day | ORAL | 0 refills | Status: DC
Start: 2020-09-14 — End: 2020-12-19

## 2020-09-14 NOTE — Progress Notes (Signed)
Date:  09/14/2020   ID:  Joseph Reyes, DOB 08-Jan-1971, MRN [id number]  PCP:  Waldon Merl, PA-C  Cardiologist:  Tessa Lerner, DO, [facility] (established care 09/14/2020) Former Cardiology Providers: Dr. Florian Buff  Date: 09/14/20 Last Office Visit: 10/27/2018  Chief Complaint  Patient presents with   New Patient (Initial Visit)   Hypertension   PVC     HPI  Joseph Reyes is a 50 y.o. male who presents to the office with a chief complaint of " follow-up after 1.5 years for management of hypertension and PVCs." Patient's past medical history and cardiovascular risk factors include: History of PSVT ablation in 1993 at [facility], history of short PR interval, hypertension, hyperlipidemia, obesity due to excess calories.   Patient has been a longstanding patient with the practice and was under the care of Dr. Florian Buff for the management of his PSVT and chronic palpitations.  Patient is here for a 1.5 year follow-up and I am seeing him for the first time.  Clinically patient states that he is doing well from a cardiovascular standpoint.  No recent hospitalizations or urgent care visits for cardiovascular symptoms.  He denies any chest pain or anginal equivalent.  He has history of PVCs and a PSVT ablation performed at [facility] and over the last 1.5 years has not had any symptoms of palpitations.  Unfortunately, patient has gained more than 50 pounds over the last 1.5 years due to dietary indiscretion.  Patient states that he is also ran out of his Lipitor which he takes for hyperlipidemia.  However, he has noticed that since he is ran out of the medication he has less hip pain and leg pain and thinks it could be secondary to statin induced discomfort.  Patient would like to consider a different statin therapy.  ALLERGIES: Allergies  Allergen Reactions   Ibuprofen Swelling   Oxycodone Rash   Percocet [Oxycodone-Acetaminophen] Itching    MEDICATION LIST PRIOR  TO VISIT: Current Meds  Medication Sig   cetirizine (ZYRTEC) 10 MG tablet Take 10 mg by mouth daily as needed.    metoprolol succinate (TOPROL-XL) 100 MG 24 hr tablet Take 100 mg by mouth daily.   [DISCONTINUED] atorvastatin (LIPITOR) 40 MG tablet TAKE 1 TABLET BY MOUTH EVERY DAY   [DISCONTINUED] lisinopril-hydrochlorothiazide (ZESTORETIC) 10-12.5 MG tablet Take 1 tablet by mouth daily.     PAST MEDICAL HISTORY: Past Medical History:  Diagnosis Date   Appendicitis    Hypercholesteremia    Hypertension    PVC (premature ventricular contraction)     PAST SURGICAL HISTORY: Past Surgical History:  Procedure Laterality Date   ABLATION  1993   For Palpitations   BACK SURGERY     Ruptured discs -- Spinal fusion   LAPAROSCOPIC APPENDECTOMY N/A 02/02/2015   Procedure: APPENDECTOMY LAPAROSCOPIC;  Surgeon: Darnell Level, MD;  Location: [facility];  Service: General;  Laterality: N/A;    FAMILY HISTORY: The patient family history includes Diabetes in his father; Healthy (age of onset: 11) in his son; Healthy (age of onset: 91) in his daughter; Hypertension in his father and mother.  SOCIAL HISTORY:  The patient  reports that he has never smoked. He has never used smokeless tobacco. He reports that he does not drink alcohol and does not use drugs.  REVIEW OF SYSTEMS: Review of Systems  Constitutional: Negative for chills and fever.  HENT: Negative for hoarse voice and nosebleeds.   Eyes: Negative for discharge, double vision and pain.  Cardiovascular: Negative for chest pain, claudication, dyspnea on exertion, leg swelling, near-syncope, orthopnea, palpitations, paroxysmal nocturnal dyspnea and syncope.  Respiratory: Negative for hemoptysis and shortness of breath.   Musculoskeletal: Negative for muscle cramps and myalgias.  Gastrointestinal: Negative for abdominal pain, constipation, diarrhea, hematemesis, hematochezia, melena, nausea and vomiting.  Neurological: Negative for  dizziness and light-headedness.    PHYSICAL EXAM: Vitals with BMI 09/14/2020 10/27/2018 10/02/2017  Height 6\' 4"  6\' 4"  -  Weight 347 lbs 3 oz 290 lbs -  BMI 42.28 35.31 -  Systolic 129 - 161  Diastolic 75 - 110  Pulse 57 - 75    CONSTITUTIONAL: Age-appropriate male, well-developed and well-nourished. No acute distress.  SKIN: Skin is warm and dry. No rash noted. No cyanosis. No pallor. No jaundice HEAD: Normocephalic and atraumatic.  EYES: No scleral icterus MOUTH/THROAT: Moist oral membranes.  NECK: No JVD present. No thyromegaly noted. No carotid bruits  LYMPHATIC: No visible cervical adenopathy.  CHEST Normal respiratory effort. No intercostal retractions  LUNGS: Clear to auscultation bilaterally.  No stridor. No wheezes. No rales.  CARDIOVASCULAR: Regular rate and rhythm, positive S1-S2, no murmurs rubs or gallops appreciated. ABDOMINAL: Obese, soft, nontender, nondistended, positive bowel sounds in all 4 quadrants, no apparent ascites.  EXTREMITIES: No peripheral edema  HEMATOLOGIC: No significant bruising NEUROLOGIC: Oriented to person, place, and time. Nonfocal. Normal muscle tone.  PSYCHIATRIC: Normal mood and affect. Normal behavior. Cooperative  CARDIAC DATABASE: EKG: 09/14/2020: Normal sinus rhythm, 60 bpm, normal axis, occasional PVC.  Echocardiogram: Echo- 10/30/2017 1. Left ventricle cavity is normal in size. Moderate concentric hypertrophy of the left ventricle. Borderline normal global wall motion. Left ventricle regional wall motion findings: No wall motion abnormalities. Calculated EF 50%. 2. Trace mitral regurgitation. 3. Trace tricuspid regurgitation. 4. Insignificant pericardial effusion.    Stress Testing: Exercise Treadmill Stress Test 05/062019: Indication:PVC, HTN The patient exercised on Bruce protocol for 08:07 min. Patient achieved 10.16 METS and reached HR 156 bpm, which is 88% of maximum age-predicted HR. Stress test terminated due to hip pain.  Exercise capacity was normal. HR Response to Exercise: Appropriate. BP Response to Exercise: Normal resting BP- appropriate response. Chest Pain: none. Arrhythmias: none. ST Changes: With peak exercise there was no ST-T changes of ischemia. Overall Impression: Normal stress test.  Heart Catheterization: None  LABORATORY DATA: CBC Latest Ref Rng & Units 10/02/2017 10/02/2017 02/02/2015  WBC 4.0 - 10.5 K/uL 5.1 5.1 7.2  Hemoglobin 13.0 - 17.0 g/dL 12/02/2017 04/04/2015 37.6  Hematocrit 39.0 - 52.0 % 46.7 46.7 41.8  Platelets 150 - 400 K/uL 279 279 237    CMP Latest Ref Rng & Units 10/02/2017 10/11/2015 02/02/2015  Glucose 65 - 99 mg/dL 10/13/2015) 91 -  BUN 6 - 20 mg/dL 15 14 -  Creatinine 04/04/2015 - 1.24 mg/dL 761(Y 0.73 7.10  Sodium 135 - 145 mmol/L 140 142 -  Potassium 3.5 - 5.1 mmol/L 4.4 4.2 -  Chloride 101 - 111 mmol/L 105 105 -  CO2 22 - 32 mmol/L 25 32 -  Calcium 8.9 - 10.3 mg/dL 9.5 9.8 -  Total Protein 6.5 - 8.1 g/dL 8.1 7.8 -  Total Bilirubin 0.3 - 1.2 mg/dL 0.9 1.1 -  Alkaline Phos 38 - 126 U/L 61 60 -  AST 15 - 41 U/L 18 22 -  ALT 17 - 63 U/L 21 34 -    Lipid Panel  No results found for: CHOL, TRIG, HDL, CHOLHDL, VLDL, LDLCALC, LDLDIRECT, LABVLDL  No components found for: NTPROBNP  No results for input(s): PROBNP in the last 8760 hours. No results for input(s): TSH in the last 8760 hours.  BMP No results for input(s): NA, K, CL, CO2, GLUCOSE, BUN, CREATININE, CALCIUM, GFRNONAA, GFRAA in the last 8760 hours.  HEMOGLOBIN A1C No results found for: HGBA1C, MPG  IMPRESSION:    ICD-10-CM   1. PVC's (premature ventricular contractions)  I49.3 EKG 12-Lead    PCV ECHOCARDIOGRAM COMPLETE    LONG TERM MONITOR (3-14 DAYS)  2. History of radiofrequency ablation procedure for cardiac arrhythmia  Z98.890   3. Benign hypertension  I10 PCV ECHOCARDIOGRAM COMPLETE    lisinopril-hydrochlorothiazide (ZESTORETIC) 10-12.5 MG tablet  4. Hypercholesterolemia  E78.00 LDL cholesterol, direct    Lipid  Panel With LDL/HDL Ratio    Comp. Metabolic Panel (12)    CK (Creatine Kinase)    CT CARDIAC SCORING (SELF PAY ONLY)  5. Class 3 severe obesity due to excess calories with serious comorbidity and body mass index (BMI) of 40.0 to 44.9 in adult [facility](HCC)  E66.01    Z68.41      RECOMMENDATIONS: Lenox AhrBrad E Chism is a 50 y.o. male whose past medical history and cardiac risk factors include: [facility], history of short PR interval, hypertension, hyperlipidemia, obesity due to excess calories.   Benign essential hypertension:  Patient's blood pressure was within acceptable range at today's office visit.  He does not check his blood pressures at home.  And I suspect that he has a high salt diet and poor dietary indiscretion as he has gained approximately 57 pounds over the last 1.5 years.  Educated on the importance of low-salt diet and moderate intensity exercise for 30 minutes a day 5 days a week.  Refill lisinopril/hydrochlorothiazide.  Hyperlipidemia:  Patient is requesting a refill on atorvastatin.  However due to his myalgias we will recheck a CMP, CK level.  Repeat a fasting lipid profile.  If within normal limits will consider a different statin therapy.  PVCs:  Patient has a known history of PVCs; however, based on the last evaluation his PVC burden was less than 1%.  EKG today normal with occasional PVCs.  We will recheck electrolytes and a 14-day Holter monitor to reevaluate his PVC burden.  I would also like to get a coronary artery calcium score given his change in multiple cardiovascular risk factors.  If the patient has no coronary artery calcium may consider medication such as flecainide to help manage PVCs if clinically indicated.  Obesity, due to excess calories:  Body mass index is 42.26 kg/m.   Has gained approximately 57 pounds since May 2020  I reviewed with the patient the importance of diet, regular physical  activity/exercise, weight loss.    Patient is educated on increasing physical activity gradually as tolerated.  With the goal of moderate intensity exercise for 30 minutes a day 5 days a week.   FINAL MEDICATION LIST END OF ENCOUNTER: Meds ordered this encounter  Medications   lisinopril-hydrochlorothiazide (ZESTORETIC) 10-12.5 MG tablet    Sig: Take 1 tablet by mouth daily.    Dispense:  90 tablet    Refill:  0    Medications Discontinued During This Encounter  Medication Reason   lisinopril-hydrochlorothiazide (ZESTORETIC) 10-12.5 MG tablet Reorder   atorvastatin (LIPITOR) 40 MG tablet Patient Preference     Current Outpatient Medications:    cetirizine (ZYRTEC) 10 MG tablet, Take 10 mg by mouth daily as needed. , Disp: , Rfl:  metoprolol succinate (TOPROL-XL) 100 MG 24 hr tablet, Take 100 mg by mouth daily., Disp: , Rfl:    lisinopril-hydrochlorothiazide (ZESTORETIC) 10-12.5 MG tablet, Take 1 tablet by mouth daily., Disp: 90 tablet, Rfl: 0  Orders Placed This Encounter  Procedures   CT CARDIAC SCORING (SELF PAY ONLY)   LDL cholesterol, direct   Lipid Panel With LDL/HDL Ratio   Comp. Metabolic Panel (12)   CK (Creatine Kinase)   LONG TERM MONITOR (3-14 DAYS)   EKG 12-Lead   PCV ECHOCARDIOGRAM COMPLETE    There are no Patient Instructions on file for this visit.   --Continue cardiac medications as reconciled in final medication list. --Return in about 6 weeks (around 10/26/2020) for Follow up PVCs, Review test results. Or sooner if needed. --Continue follow-up with your primary care physician regarding the management of your other chronic comorbid conditions.  Patient's questions and concerns were addressed to his satisfaction. He voices understanding of the instructions provided during this encounter.   This note was created using a voice recognition software as a result there may be grammatical errors inadvertently enclosed that do not reflect the nature  of this encounter. Every attempt is made to correct such errors.  Tessa Lerner, [place], [facility]  Pager: [phone] Office: [phone]

## 2020-09-20 LAB — LDL CHOLESTEROL, DIRECT: LDL Direct: 205 mg/dL — ABNORMAL HIGH (ref 0–99)

## 2020-09-20 LAB — CK: Total CK: 155 U/L (ref 49–439)

## 2020-09-20 LAB — COMP. METABOLIC PANEL (12)
AST: 26 IU/L (ref 0–40)
Albumin/Globulin Ratio: 1.6 (ref 1.2–2.2)
Albumin: 5.1 g/dL — ABNORMAL HIGH (ref 4.0–5.0)
Alkaline Phosphatase: 80 IU/L (ref 44–121)
BUN/Creatinine Ratio: 14 (ref 9–20)
BUN: 15 mg/dL (ref 6–24)
Bilirubin Total: 1.1 mg/dL (ref 0.0–1.2)
Calcium: 10.5 mg/dL — ABNORMAL HIGH (ref 8.7–10.2)
Chloride: 100 mmol/L (ref 96–106)
Creatinine, Ser: 1.1 mg/dL (ref 0.76–1.27)
Globulin, Total: 3.1 g/dL (ref 1.5–4.5)
Glucose: 95 mg/dL (ref 65–99)
Potassium: 5.2 mmol/L (ref 3.5–5.2)
Sodium: 142 mmol/L (ref 134–144)
Total Protein: 8.2 g/dL (ref 6.0–8.5)
eGFR: 83 mL/min/{1.73_m2} (ref >59)

## 2020-09-20 LAB — LIPID PANEL WITH LDL/HDL RATIO
Cholesterol, Total: 277 mg/dL — ABNORMAL HIGH (ref 100–199)
HDL: 39 mg/dL — ABNORMAL LOW (ref >39)
LDL Chol Calc (NIH): 209 mg/dL — ABNORMAL HIGH (ref 0–99)
LDL/HDL Ratio: 5.4 ratio — ABNORMAL HIGH (ref 0.0–3.6)
Triglycerides: 155 mg/dL — ABNORMAL HIGH (ref 0–149)
VLDL Cholesterol Cal: 29 mg/dL (ref 5–40)

## 2020-09-25 NOTE — Progress Notes (Signed)
No answer left a vm to call back

## 2020-09-26 ENCOUNTER — Other Ambulatory Visit: Payer: Managed Care, Other (non HMO)

## 2020-09-26 ENCOUNTER — Inpatient Hospital Stay: Payer: Managed Care, Other (non HMO)

## 2020-09-26 DIAGNOSIS — I493 Ventricular premature depolarization: Secondary | ICD-10-CM

## 2020-09-26 NOTE — Progress Notes (Signed)
Second attempt no answer left a vm to call back

## 2020-09-29 NOTE — Progress Notes (Signed)
Third attempt to reach out to patient no answer left a vm

## 2020-10-04 ENCOUNTER — Telehealth: Payer: Self-pay

## 2020-10-04 NOTE — Telephone Encounter (Signed)
   Patient returned call back today regarding a message about lab results. I have discussed results with him and patient is aware that he is to start Crestor 40mg  and that I am sending it in now. Patient is also aware that he is to repeat lab. Right now he is currently scheduled to return to lab on the 10th and his follow-up appointment the 12th of May,  But the message says to start labs 6 weeks after starting Crestor.  He will be starting Crestor tonight, do you want him to still come in 6 weeks after starting, or keep appointment and have him still go to lab on the 10th? Please advise.

## 2020-10-05 NOTE — Telephone Encounter (Signed)
Great question. Please delay his appt 7 weeks out after the repeat blood work is done. IF he has concerns or worsening symptoms he can come in sooner or keep the appt.

## 2020-10-05 NOTE — Telephone Encounter (Signed)
Patient called back, he has not started the Crestor, but says that he will pick it up by tonight. I explained that he is to go to [facility] 6 weeks after starting and call back to schedule FU appointment 7 weeks after starting Crestor. Patient stated that he understands.

## 2020-10-09 ENCOUNTER — Other Ambulatory Visit: Payer: Self-pay

## 2020-10-09 MED ORDER — ROSUVASTATIN CALCIUM 40 MG PO TABS: 40.0000 mg | ORAL_TABLET | Freq: Every day | ORAL | 3 refills | Status: AC

## 2020-10-10 ENCOUNTER — Ambulatory Visit: Payer: Managed Care, Other (non HMO)

## 2020-10-10 ENCOUNTER — Other Ambulatory Visit: Payer: Self-pay

## 2020-10-10 ENCOUNTER — Inpatient Hospital Stay: Payer: Managed Care, Other (non HMO)

## 2020-10-10 DIAGNOSIS — I493 Ventricular premature depolarization: Secondary | ICD-10-CM

## 2020-10-10 DIAGNOSIS — I10 Essential (primary) hypertension: Secondary | ICD-10-CM

## 2020-10-11 ENCOUNTER — Other Ambulatory Visit: Payer: Self-pay

## 2020-10-11 DIAGNOSIS — Z9889 Other specified postprocedural states: Secondary | ICD-10-CM

## 2020-10-11 DIAGNOSIS — I493 Ventricular premature depolarization: Secondary | ICD-10-CM

## 2020-10-11 DIAGNOSIS — E7800 Pure hypercholesterolemia, unspecified: Secondary | ICD-10-CM

## 2020-10-11 DIAGNOSIS — I119 Hypertensive heart disease without heart failure: Secondary | ICD-10-CM

## 2020-10-11 DIAGNOSIS — I10 Essential (primary) hypertension: Secondary | ICD-10-CM

## 2020-10-11 NOTE — Progress Notes (Signed)
Patient was made aware when he came in for his monitor, rx has been sent and patient is aware of labs he needs to get done

## 2020-10-18 NOTE — Progress Notes (Signed)
Spoke to patient he is aware

## 2020-10-23 ENCOUNTER — Telehealth: Payer: Self-pay

## 2020-10-23 NOTE — Telephone Encounter (Signed)
LVM advising patient to call back and schedule TOC. 

## 2020-11-02 ENCOUNTER — Ambulatory Visit: Payer: Managed Care, Other (non HMO) | Admitting: Cardiology

## 2020-11-21 NOTE — Progress Notes (Signed)
Attempted to call pt, no answer. Left vm requesting call back.

## 2020-11-22 NOTE — Progress Notes (Signed)
No answer left a vm to call back

## 2020-11-23 NOTE — Progress Notes (Signed)
3rd attempt : Called patient, NA, LMAM

## 2020-12-01 ENCOUNTER — Ambulatory Visit: Payer: Managed Care, Other (non HMO) | Admitting: Cardiology

## 2020-12-18 ENCOUNTER — Other Ambulatory Visit: Payer: Self-pay | Admitting: Cardiology

## 2020-12-18 DIAGNOSIS — I10 Essential (primary) hypertension: Secondary | ICD-10-CM

## 2020-12-18 NOTE — Progress Notes (Signed)
Called pt no answer, left a vm to return call back.

## 2020-12-19 NOTE — Progress Notes (Signed)
Called pt, no answer. Left vm requesting call back?

## 2020-12-20 NOTE — Progress Notes (Signed)
6th attempt to contact pt, no answer. No attempt has been made to call us back.

## 2021-03-16 ENCOUNTER — Other Ambulatory Visit: Payer: Self-pay | Admitting: Cardiology

## 2021-03-16 DIAGNOSIS — I10 Essential (primary) hypertension: Secondary | ICD-10-CM

## 2021-04-24 NOTE — Progress Notes (Signed)
Please contact patient to schedule FU with Dr. Odis Hollingshead.

## 2021-06-19 ENCOUNTER — Other Ambulatory Visit: Payer: Self-pay | Admitting: Cardiology

## 2021-06-19 DIAGNOSIS — I10 Essential (primary) hypertension: Secondary | ICD-10-CM

## 2021-10-16 ENCOUNTER — Other Ambulatory Visit: Payer: Self-pay | Admitting: Cardiology

## 2021-11-29 ENCOUNTER — Other Ambulatory Visit: Payer: Self-pay | Admitting: Cardiology

## 2022-01-18 ENCOUNTER — Other Ambulatory Visit: Payer: Self-pay | Admitting: Cardiology

## 2022-01-18 DIAGNOSIS — I10 Essential (primary) hypertension: Secondary | ICD-10-CM

## 2022-02-21 ENCOUNTER — Other Ambulatory Visit: Payer: Self-pay | Admitting: Cardiology

## 2022-02-21 DIAGNOSIS — I10 Essential (primary) hypertension: Secondary | ICD-10-CM
